# Patient Record
Sex: Female | Born: 1970 | Race: White | Hispanic: No | Marital: Single | Smoking: Never smoker
Health system: Southern US, Community
[De-identification: ages and names within clinical notes are randomized; demographics above are authoritative.]

## PROBLEM LIST (undated history)

## (undated) ENCOUNTER — Ambulatory Visit: Disposition: A | Discharge status: Home / Self Care

## (undated) DIAGNOSIS — I739 Peripheral vascular disease, unspecified: Secondary | ICD-10-CM

## (undated) DIAGNOSIS — K635 Polyp of colon: Secondary | ICD-10-CM

## (undated) DIAGNOSIS — B009 Herpesviral infection, unspecified: Secondary | ICD-10-CM

## (undated) DIAGNOSIS — E785 Hyperlipidemia, unspecified: Secondary | ICD-10-CM

## (undated) DIAGNOSIS — Z860100 Personal history of colon polyps, unspecified: Secondary | ICD-10-CM

## (undated) DIAGNOSIS — G709 Myoneural disorder, unspecified: Secondary | ICD-10-CM

## (undated) DIAGNOSIS — F32A Depression, unspecified: Secondary | ICD-10-CM

## (undated) DIAGNOSIS — Z460 Encounter for fitting and adjustment of spectacles and contact lenses: Secondary | ICD-10-CM

## (undated) DIAGNOSIS — R5383 Other fatigue: Secondary | ICD-10-CM

## (undated) DIAGNOSIS — K8060 Calculus of gallbladder and bile duct with cholecystitis, unspecified, without obstruction: Secondary | ICD-10-CM

## (undated) DIAGNOSIS — Z87442 Personal history of urinary calculi: Secondary | ICD-10-CM

## (undated) DIAGNOSIS — G35 Multiple sclerosis: Secondary | ICD-10-CM

## (undated) DIAGNOSIS — K469 Unspecified abdominal hernia without obstruction or gangrene: Secondary | ICD-10-CM

## (undated) DIAGNOSIS — N870 Mild cervical dysplasia: Secondary | ICD-10-CM

## (undated) DIAGNOSIS — F329 Major depressive disorder, single episode, unspecified: Secondary | ICD-10-CM

## (undated) HISTORY — DX: Polyp of colon: K63.5

## (undated) HISTORY — DX: Hyperlipidemia, unspecified: E78.5

## (undated) HISTORY — DX: Personal history of urinary calculi: Z87.442

## (undated) HISTORY — DX: Encounter for fitting and adjustment of spectacles and contact lenses: Z46.0

## (undated) HISTORY — PX: OTHER SURGICAL HISTORY: SHX169

## (undated) HISTORY — DX: Depression, unspecified: F32.A

## (undated) HISTORY — DX: Personal history of colonic polyps: Z86.010

## (undated) HISTORY — DX: Personal history of colon polyps, unspecified: Z86.0100

## (undated) HISTORY — DX: Other fatigue: R53.83

## (undated) HISTORY — DX: Mild cervical dysplasia: N87.0

## (undated) HISTORY — DX: Multiple sclerosis: G35

## (undated) HISTORY — DX: Herpesviral infection, unspecified: B00.9

## (undated) HISTORY — PX: COLONOSCOPY: SHX174

## (undated) HISTORY — DX: Major depressive disorder, single episode, unspecified: F32.9

## (undated) HISTORY — DX: Unspecified abdominal hernia without obstruction or gangrene: K46.9

---

## 1995-02-22 HISTORY — PX: HERNIA REPAIR: SHX51

## 1998-09-26 ENCOUNTER — Other Ambulatory Visit: Admission: RE | Admit: 1998-09-26 | Discharge: 1998-09-26 | Payer: Self-pay | Admitting: Internal Medicine

## 1999-10-06 ENCOUNTER — Other Ambulatory Visit: Admission: RE | Admit: 1999-10-06 | Discharge: 1999-10-06 | Payer: Self-pay | Admitting: *Deleted

## 2000-11-12 ENCOUNTER — Other Ambulatory Visit: Admission: RE | Admit: 2000-11-12 | Discharge: 2000-11-12 | Payer: Self-pay | Admitting: Obstetrics and Gynecology

## 2000-11-12 ENCOUNTER — Other Ambulatory Visit: Admission: RE | Admit: 2000-11-12 | Discharge: 2000-11-12 | Payer: Self-pay | Admitting: *Deleted

## 2000-12-24 ENCOUNTER — Other Ambulatory Visit: Admission: RE | Admit: 2000-12-24 | Discharge: 2000-12-24 | Payer: Self-pay | Admitting: Obstetrics and Gynecology

## 2000-12-24 ENCOUNTER — Encounter (INDEPENDENT_AMBULATORY_CARE_PROVIDER_SITE_OTHER): Payer: Self-pay

## 2001-07-20 ENCOUNTER — Other Ambulatory Visit: Admission: RE | Admit: 2001-07-20 | Discharge: 2001-07-20 | Payer: Self-pay | Admitting: Obstetrics and Gynecology

## 2001-10-25 ENCOUNTER — Other Ambulatory Visit: Admission: RE | Admit: 2001-10-25 | Discharge: 2001-10-25 | Payer: Self-pay | Admitting: Obstetrics and Gynecology

## 2002-11-06 ENCOUNTER — Other Ambulatory Visit: Admission: RE | Admit: 2002-11-06 | Discharge: 2002-11-06 | Payer: Self-pay | Admitting: Obstetrics and Gynecology

## 2003-07-05 ENCOUNTER — Other Ambulatory Visit: Admission: RE | Admit: 2003-07-05 | Discharge: 2003-07-05 | Payer: Self-pay | Admitting: Obstetrics and Gynecology

## 2003-11-19 ENCOUNTER — Other Ambulatory Visit: Admission: RE | Admit: 2003-11-19 | Discharge: 2003-11-19 | Payer: Self-pay | Admitting: Obstetrics and Gynecology

## 2005-01-27 ENCOUNTER — Other Ambulatory Visit: Admission: RE | Admit: 2005-01-27 | Discharge: 2005-01-27 | Payer: Self-pay | Admitting: Obstetrics and Gynecology

## 2006-02-16 ENCOUNTER — Other Ambulatory Visit: Admission: RE | Admit: 2006-02-16 | Discharge: 2006-02-16 | Payer: Self-pay | Admitting: Obstetrics & Gynecology

## 2007-03-15 ENCOUNTER — Other Ambulatory Visit: Admission: RE | Admit: 2007-03-15 | Discharge: 2007-03-15 | Payer: Self-pay | Admitting: Obstetrics & Gynecology

## 2007-10-14 ENCOUNTER — Other Ambulatory Visit: Admission: RE | Admit: 2007-10-14 | Discharge: 2007-10-14 | Payer: Self-pay | Admitting: Obstetrics and Gynecology

## 2008-04-17 ENCOUNTER — Other Ambulatory Visit: Admission: RE | Admit: 2008-04-17 | Discharge: 2008-04-17 | Payer: Self-pay | Admitting: Obstetrics and Gynecology

## 2008-05-04 ENCOUNTER — Encounter: Admission: RE | Admit: 2008-05-04 | Discharge: 2008-05-04 | Payer: Self-pay | Admitting: Obstetrics and Gynecology

## 2008-05-22 ENCOUNTER — Ambulatory Visit: Payer: Self-pay | Admitting: Pulmonary Disease

## 2008-05-22 DIAGNOSIS — G4710 Hypersomnia, unspecified: Secondary | ICD-10-CM | POA: Insufficient documentation

## 2008-05-22 DIAGNOSIS — E785 Hyperlipidemia, unspecified: Secondary | ICD-10-CM | POA: Insufficient documentation

## 2008-05-22 DIAGNOSIS — J309 Allergic rhinitis, unspecified: Secondary | ICD-10-CM | POA: Insufficient documentation

## 2009-03-13 ENCOUNTER — Ambulatory Visit: Payer: Self-pay | Admitting: Diagnostic Radiology

## 2009-03-13 ENCOUNTER — Emergency Department (HOSPITAL_BASED_OUTPATIENT_CLINIC_OR_DEPARTMENT_OTHER): Admission: EM | Admit: 2009-03-13 | Discharge: 2009-03-13 | Payer: Self-pay | Admitting: Emergency Medicine

## 2009-05-06 ENCOUNTER — Encounter: Admission: RE | Admit: 2009-05-06 | Discharge: 2009-05-06 | Payer: Self-pay | Admitting: Obstetrics and Gynecology

## 2010-07-20 IMAGING — MG MM DIGITAL SCREENING BILAT W/ CAD
4 series · 4 of 4 positions shown · non-contrast
Comparison: none

DG SCREEN MAMMOGRAM BILATERAL
Bilateral CC and MLO view(s) were taken.
Technologist: Amh Malawi

DIGITAL SCREENING MAMMOGRAM WITH CAD:
There are scattered fibroglandular densities.  No masses or malignant type calcifications are 
identified.

[R CC]
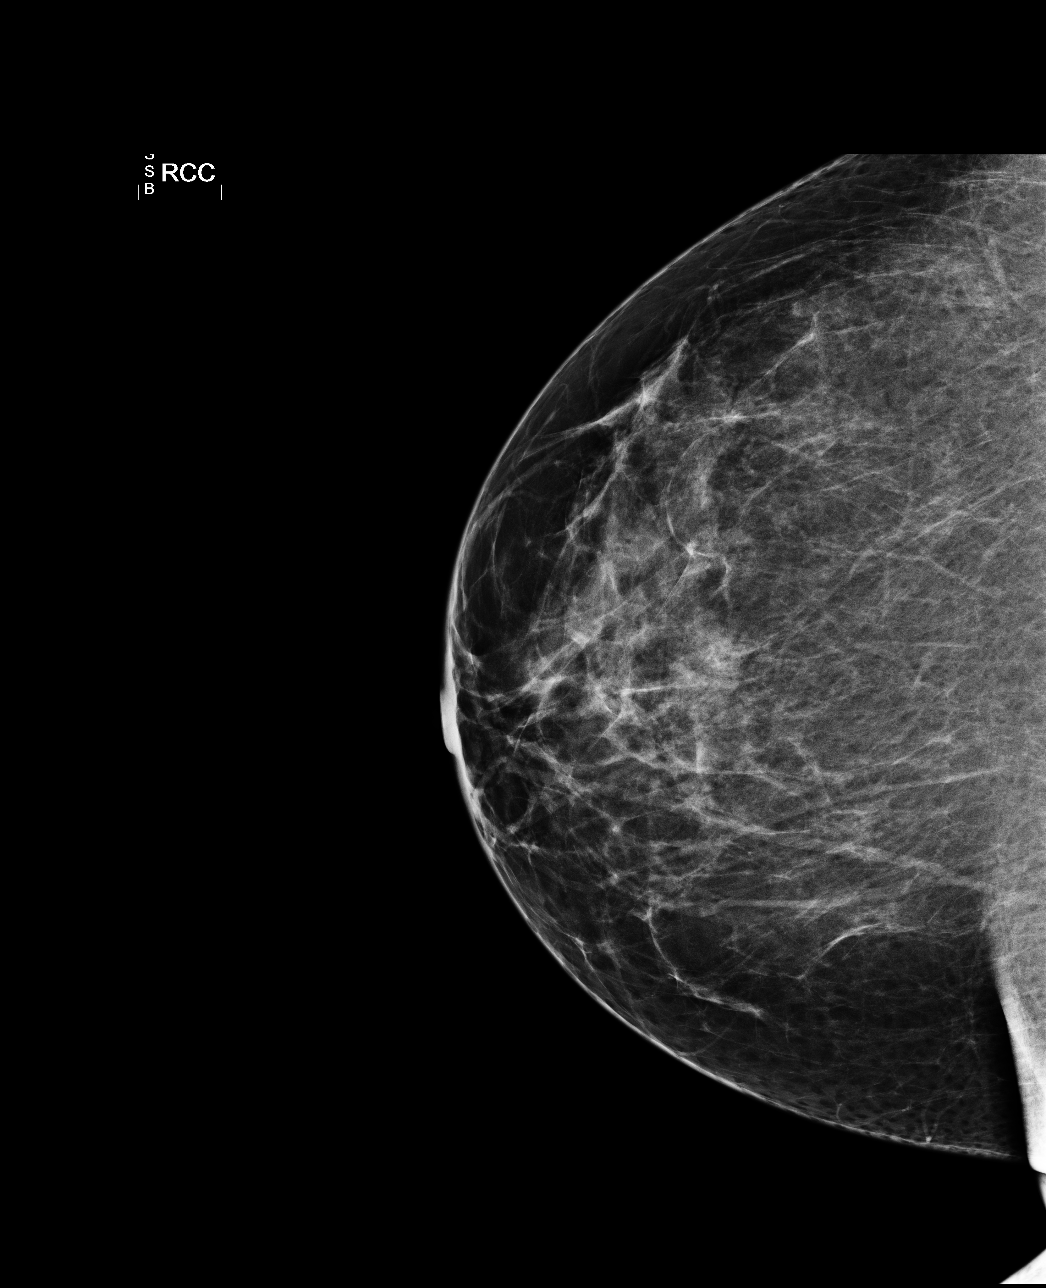

[L CC]
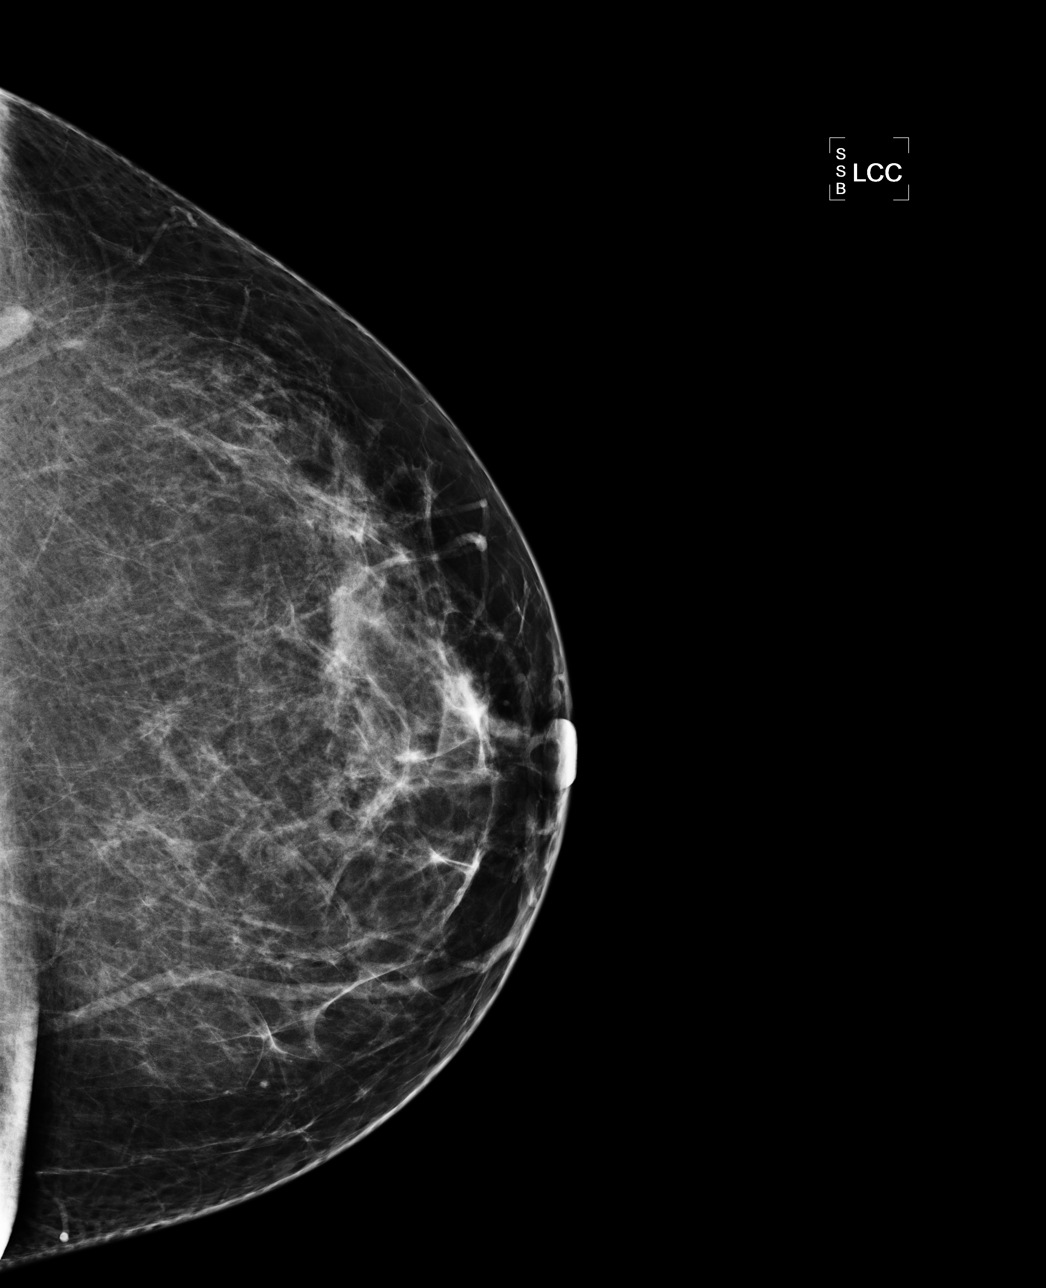

[L MLO]
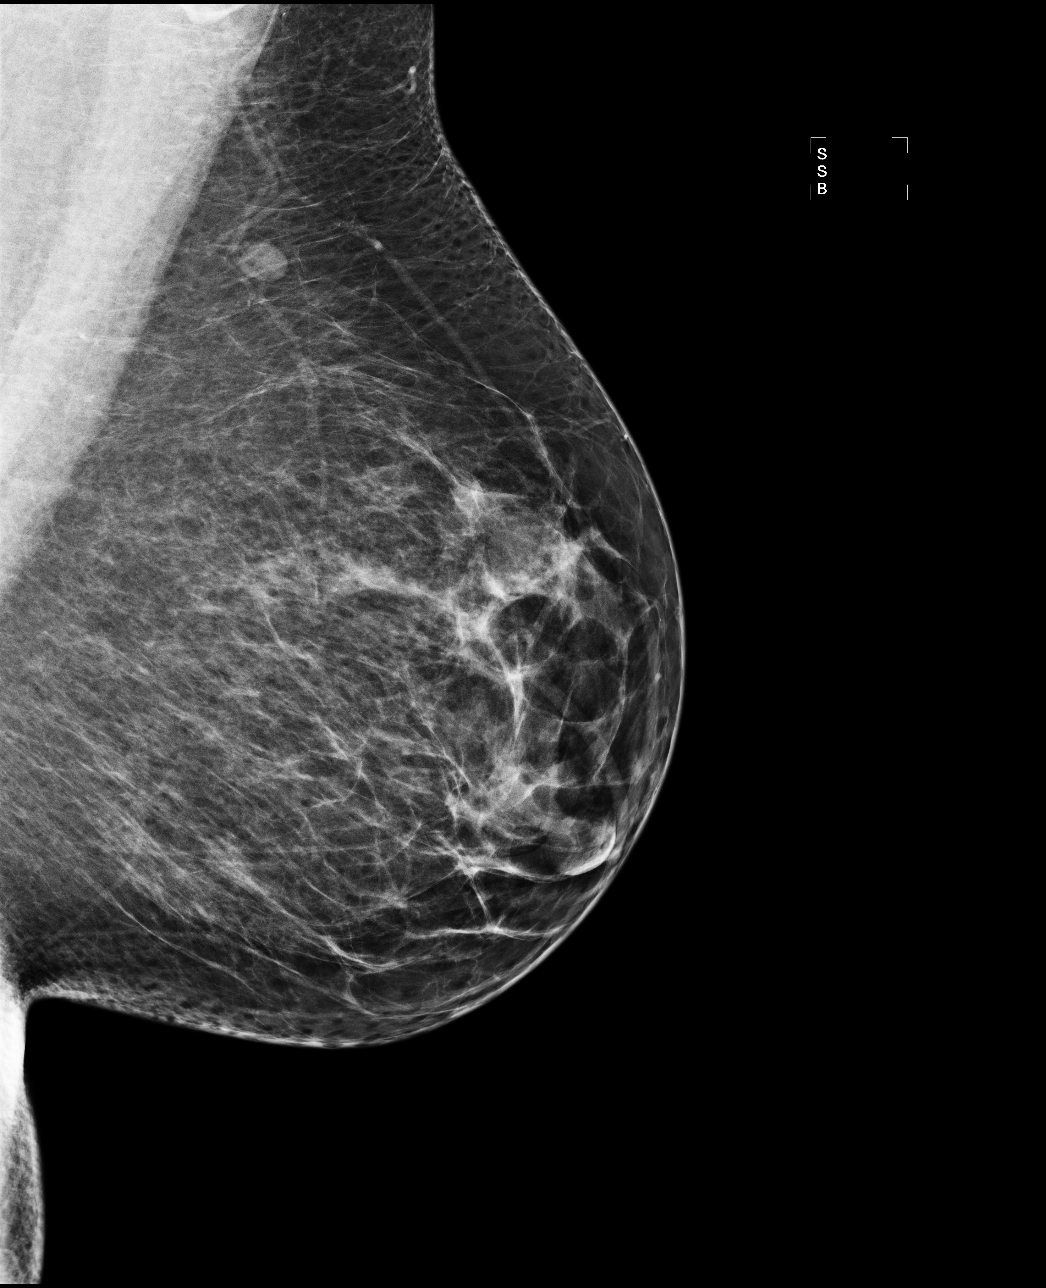

[R MLO]
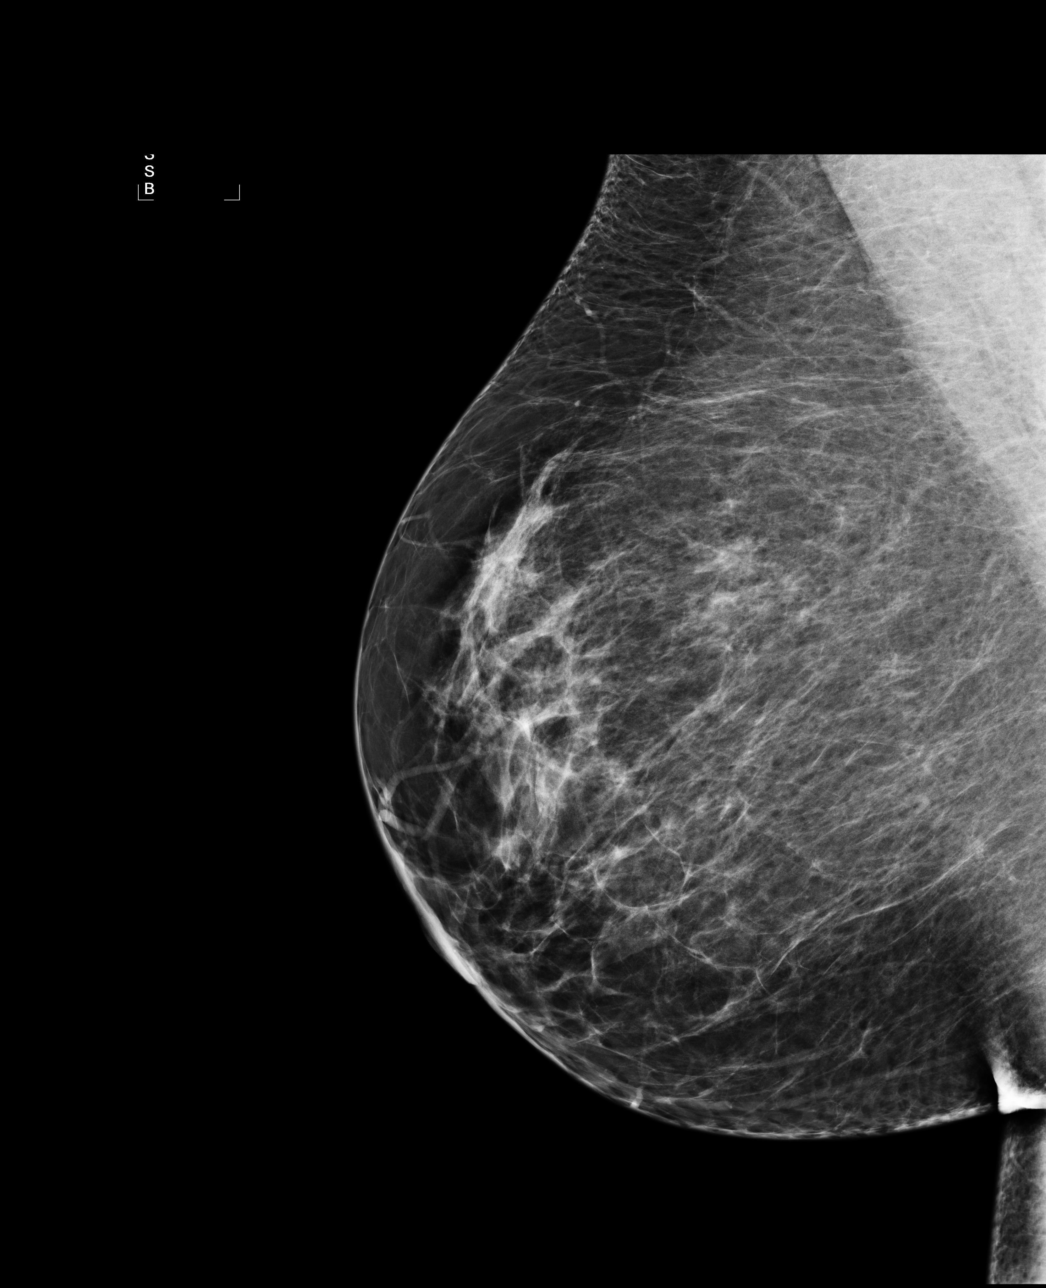

[4 of 4 positions shown; findings below may reference images not displayed]

IMPRESSION: No specific mammographic evidence of malignancy.  Next screening mammogram is recommended in one 
year.

ASSESSMENT: Negative - BI-RADS 1

Screening mammogram in 1 year.
ANALYZED BY COMPUTER AIDED DETECTION. , THIS PROCEDURE WAS A DIGITAL MAMMOGRAM.

## 2010-09-13 ENCOUNTER — Encounter: Payer: Self-pay | Admitting: Obstetrics and Gynecology

## 2010-09-14 ENCOUNTER — Encounter: Payer: Self-pay | Admitting: Obstetrics and Gynecology

## 2010-11-10 ENCOUNTER — Emergency Department (INDEPENDENT_AMBULATORY_CARE_PROVIDER_SITE_OTHER): Payer: 59

## 2010-11-10 ENCOUNTER — Emergency Department (HOSPITAL_BASED_OUTPATIENT_CLINIC_OR_DEPARTMENT_OTHER)
Admission: EM | Admit: 2010-11-10 | Discharge: 2010-11-11 | Disposition: A | Discharge status: Home / Self Care | Payer: 59 | Attending: Emergency Medicine | Admitting: Emergency Medicine

## 2010-11-10 DIAGNOSIS — E7800 Pure hypercholesterolemia, unspecified: Secondary | ICD-10-CM | POA: Insufficient documentation

## 2010-11-10 DIAGNOSIS — G35 Multiple sclerosis: Secondary | ICD-10-CM | POA: Insufficient documentation

## 2010-11-10 DIAGNOSIS — R109 Unspecified abdominal pain: Secondary | ICD-10-CM | POA: Insufficient documentation

## 2010-11-10 DIAGNOSIS — N200 Calculus of kidney: Secondary | ICD-10-CM | POA: Insufficient documentation

## 2010-11-10 DIAGNOSIS — N1330 Unspecified hydronephrosis: Secondary | ICD-10-CM

## 2010-11-10 DIAGNOSIS — N201 Calculus of ureter: Secondary | ICD-10-CM

## 2010-11-10 LAB — URINE MICROSCOPIC-ADD ON

## 2010-11-10 LAB — PREGNANCY, URINE: Preg Test, Ur: NEGATIVE

## 2010-11-10 LAB — BASIC METABOLIC PANEL
BUN: 17 mg/dL (ref 6–23)
CO2: 22 mEq/L (ref 19–32)
Calcium: 9.1 mg/dL (ref 8.4–10.5)
Creatinine, Ser: 1.2 mg/dL (ref 0.4–1.2)
Glucose, Bld: 126 mg/dL — ABNORMAL HIGH (ref 70–99)

## 2010-11-10 LAB — URINALYSIS, ROUTINE W REFLEX MICROSCOPIC
Leukocytes, UA: NEGATIVE
Nitrite: NEGATIVE
Specific Gravity, Urine: 1.035 — ABNORMAL HIGH (ref 1.005–1.030)
Urobilinogen, UA: 0.2 mg/dL (ref 0.0–1.0)
pH: 6 (ref 5.0–8.0)

## 2010-11-30 LAB — DIFFERENTIAL
Basophils Relative: 1 % (ref 0–1)
Eosinophils Absolute: 0.1 10*3/uL (ref 0.0–0.7)
Monocytes Absolute: 0.4 10*3/uL (ref 0.1–1.0)
Monocytes Relative: 5 % (ref 3–12)
Neutrophils Relative %: 87 % — ABNORMAL HIGH (ref 43–77)

## 2010-11-30 LAB — COMPREHENSIVE METABOLIC PANEL
ALT: 25 U/L (ref 0–35)
Albumin: 4.2 g/dL (ref 3.5–5.2)
Alkaline Phosphatase: 103 U/L (ref 39–117)
Glucose, Bld: 118 mg/dL — ABNORMAL HIGH (ref 70–99)
Potassium: 4 mEq/L (ref 3.5–5.1)
Sodium: 141 mEq/L (ref 135–145)
Total Protein: 7.7 g/dL (ref 6.0–8.3)

## 2010-11-30 LAB — URINALYSIS, ROUTINE W REFLEX MICROSCOPIC
Bilirubin Urine: NEGATIVE
Glucose, UA: NEGATIVE mg/dL
Ketones, ur: NEGATIVE mg/dL
Protein, ur: NEGATIVE mg/dL

## 2010-11-30 LAB — URINE MICROSCOPIC-ADD ON

## 2010-11-30 LAB — CBC
Hemoglobin: 14.1 g/dL (ref 12.0–15.0)
RDW: 13.1 % (ref 11.5–15.5)

## 2011-08-20 ENCOUNTER — Encounter (INDEPENDENT_AMBULATORY_CARE_PROVIDER_SITE_OTHER): Payer: Self-pay | Admitting: Surgery

## 2011-08-20 ENCOUNTER — Other Ambulatory Visit (INDEPENDENT_AMBULATORY_CARE_PROVIDER_SITE_OTHER): Payer: Self-pay | Admitting: General Surgery

## 2011-08-20 ENCOUNTER — Ambulatory Visit (INDEPENDENT_AMBULATORY_CARE_PROVIDER_SITE_OTHER): Payer: 59 | Admitting: Surgery

## 2011-08-20 DIAGNOSIS — G35 Multiple sclerosis: Secondary | ICD-10-CM

## 2011-08-20 NOTE — Progress Notes (Signed)
Chief Complaint:  Obesity, BMI of 41.8  History of Present Illness:  Samantha Tyler is an 40 y.o. female who has had lifelong issues with her weight. She has been to one of our sessions and is interested in having a laparoscopic adjustable gastric band. She has never had any abdominal surgery. She has tried multiple weight loss attempts with limited success. Her most recent issues been multiple sclerosis which presented as itching, numbness and tingling of her back when she got home from the beach a few years ago. She was initially diagnosed and treated at [facility] but now her daughter from that institution is with [facility] out in advance. Before actually having the pain and she will see the approval of her neurologist.  She went to proceed however the workup and we will begin its journey.  Past Medical History  Diagnosis Date  . Hyperlipidemia   . Depression   . Chronic kidney disease   . History of colon polyps   . Multiple sclerosis   . Fatigue   . Hernia   . Colon polyps   . History of colon polyps   . Contact lens/glasses fitting     Past Surgical History  Procedure Date  . Hernia repair 02/1995    inguinal    Current Outpatient Prescriptions  Medication Sig Dispense Refill  . natalizumab (TYSABRI) 300 MG/15ML injection Inject into the vein every 30 (thirty) days.        Marland Kitchen atorvastatin (LIPITOR) 80 MG tablet 80 mg daily.       Marland Kitchen buPROPion (WELLBUTRIN XL) 300 MG 24 hr tablet 300 mg daily.       . citalopram (CELEXA) 40 MG tablet daily.       Marland Kitchen spironolactone (ALDACTONE) 100 MG tablet        Review of patient's allergies indicates no known allergies. Family History  Problem Relation Age of Onset  . Hyperlipidemia Mother   . Hypertension Mother   . Hypothyroidism Mother   . Hyperlipidemia Father   . Hypertension Father    Social History:   reports that she has never smoked. She has never used smokeless tobacco. She reports that she does not drink alcohol or use  illicit drugs.   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 Her review of systems is positive for loss of sleep and fatigue, high cholesterol, colon polyp, hernia, kidney disease, and glasses. She also had a right superficial phlebitis treated with compresses and Mobic.  Physical Exam:   Blood pressure 120/68, pulse 68, temperature 97.8 F (36.6 C), temperature source Temporal, resp. rate 18, height 5\' 9"  (1.753 m), weight 282 lb 12.8 oz (128.277 kg). Body mass index is 41.76 kg/(m^2).  Gen:  WDWN white female NAD  Neurological: Alert and oriented to person, place, and time. Motor and sensory function is grossly intact  Head: Normocephalic and atraumatic.  Eyes: Conjunctivae are normal. Pupils are equal, round, and reactive to light. No scleral icterus.  Neck: Normal range of motion. Neck supple. No tracheal deviation or thyromegaly present.  Cardiovascular:  SR without murmurs or gallops.  No carotid bruits Respiratory: Effort normal.  No respiratory distress. No chest wall tenderness. Breath sounds normal.  No wheezes, rales or rhonchi.  Abdomen:  nontender no surgery GU: Musculoskeletal: Normal range of motion. Extremities are nontender. No cyanosis, edema or clubbing noted Lymphadenopathy: No cervical, preauricular, postauricular or axillary adenopathy is present Skin: Skin is warm and  dry. No rash noted. No diaphoresis. No erythema. No pallor. Pscyh: Normal mood and affect. Behavior is normal. Judgment and thought content normal.   LABORATORY RESULTS: No results found for this or any previous visit (from the past 48 hour(s)).  RADIOLOGY RESULTS: No results found.  Problem List: Patient Active Problem List  Diagnoses  . HYPERLIPIDEMIA  . HYPERSOMNIA, PERSISTENT  . ALLERGIC RHINITIS  . MS (multiple sclerosis)  . Obesity, Class III, BMI 40-49.9 (morbid obesity)    Assessment & Plan: Morbid obesity in a patient with a  BMI of 42 and is interested in a LAP-BAND. The LAP-BAND booklet has been given to her. We'll begin workup.    Matt B. Daphine Deutscher, MD, [facility] [phone] beeper [phone]  08/20/2011 11:54 AM

## 2011-08-20 NOTE — Patient Instructions (Addendum)
Scheduling per Leandrew Koyanagi

## 2011-08-21 ENCOUNTER — Other Ambulatory Visit: Payer: Self-pay | Admitting: Obstetrics and Gynecology

## 2011-08-21 DIAGNOSIS — Z1231 Encounter for screening mammogram for malignant neoplasm of breast: Secondary | ICD-10-CM

## 2011-09-02 ENCOUNTER — Encounter: Payer: Self-pay | Admitting: *Deleted

## 2011-09-02 ENCOUNTER — Encounter: Payer: 59 | Attending: Surgery | Admitting: *Deleted

## 2011-09-02 DIAGNOSIS — Z01818 Encounter for other preprocedural examination: Secondary | ICD-10-CM | POA: Insufficient documentation

## 2011-09-02 DIAGNOSIS — Z713 Dietary counseling and surveillance: Secondary | ICD-10-CM | POA: Insufficient documentation

## 2011-09-02 NOTE — Patient Instructions (Signed)
   Follow Pre-Op Nutrition Goals to prepare for LAGB Surgery.   Call the [facility] at [phone] once you have been given your surgery date to enrolled in the Pre-Op Nutrition Class. You will need to attend this nutrition class 3-4 weeks prior to your surgery. 

## 2011-09-02 NOTE — Progress Notes (Signed)
  Pre-Op Assessment Visit: Pre-Operative LAGB Surgery  Medical Nutrition Therapy:  Appt start time: 0830 end time:  0930.  Patient was seen on 09/02/2011 for Pre-Operative LAGB Nutrition Assessment. Assessment and letter of approval faxed to [facility] Bariatric Surgery Program coordinator on 09/02/2011.  Approval letter sent to [facility] and will be available in the chart under the media tab.  Handouts given during visit include:  Pre-Op Goals Handout  Patient to call for Pre-Op and Post-Op Nutrition Education at the [facility] when surgery is scheduled.

## 2011-09-08 ENCOUNTER — Encounter (HOSPITAL_COMMUNITY): Payer: Self-pay | Admitting: Pharmacy Technician

## 2011-09-10 ENCOUNTER — Ambulatory Visit (HOSPITAL_COMMUNITY)
Admission: RE | Admit: 2011-09-10 | Discharge: 2011-09-10 | Disposition: A | Discharge status: Home / Self Care | Payer: 59 | Source: Ambulatory Visit | Attending: Surgery | Admitting: Surgery

## 2011-09-10 ENCOUNTER — Other Ambulatory Visit: Payer: Self-pay

## 2011-09-10 DIAGNOSIS — E785 Hyperlipidemia, unspecified: Secondary | ICD-10-CM | POA: Insufficient documentation

## 2011-09-10 DIAGNOSIS — F329 Major depressive disorder, single episode, unspecified: Secondary | ICD-10-CM | POA: Insufficient documentation

## 2011-09-10 DIAGNOSIS — N289 Disorder of kidney and ureter, unspecified: Secondary | ICD-10-CM | POA: Insufficient documentation

## 2011-09-10 DIAGNOSIS — F3289 Other specified depressive episodes: Secondary | ICD-10-CM | POA: Insufficient documentation

## 2011-09-10 DIAGNOSIS — Z6841 Body Mass Index (BMI) 40.0 and over, adult: Secondary | ICD-10-CM | POA: Insufficient documentation

## 2011-09-10 DIAGNOSIS — Z13820 Encounter for screening for osteoporosis: Secondary | ICD-10-CM | POA: Insufficient documentation

## 2011-09-16 ENCOUNTER — Ambulatory Visit
Admission: RE | Admit: 2011-09-16 | Discharge: 2011-09-16 | Disposition: A | Discharge status: Home / Self Care | Payer: 59 | Source: Ambulatory Visit | Attending: Obstetrics and Gynecology | Admitting: Obstetrics and Gynecology

## 2011-09-16 ENCOUNTER — Ambulatory Visit (HOSPITAL_COMMUNITY)
Admission: RE | Admit: 2011-09-16 | Discharge: 2011-09-16 | Disposition: A | Discharge status: Home / Self Care | Payer: 59 | Source: Ambulatory Visit | Attending: Surgery | Admitting: Surgery

## 2011-09-16 DIAGNOSIS — Z6841 Body Mass Index (BMI) 40.0 and over, adult: Secondary | ICD-10-CM | POA: Insufficient documentation

## 2011-09-16 DIAGNOSIS — Z1231 Encounter for screening mammogram for malignant neoplasm of breast: Secondary | ICD-10-CM

## 2011-09-16 DIAGNOSIS — N289 Disorder of kidney and ureter, unspecified: Secondary | ICD-10-CM | POA: Insufficient documentation

## 2011-09-16 DIAGNOSIS — E785 Hyperlipidemia, unspecified: Secondary | ICD-10-CM | POA: Insufficient documentation

## 2011-09-16 DIAGNOSIS — G35 Multiple sclerosis: Secondary | ICD-10-CM | POA: Insufficient documentation

## 2011-09-17 ENCOUNTER — Ambulatory Visit (HOSPITAL_BASED_OUTPATIENT_CLINIC_OR_DEPARTMENT_OTHER): Payer: 59 | Attending: Surgery | Admitting: Radiology

## 2011-09-17 DIAGNOSIS — G4733 Obstructive sleep apnea (adult) (pediatric): Secondary | ICD-10-CM | POA: Insufficient documentation

## 2011-09-18 ENCOUNTER — Encounter (HOSPITAL_COMMUNITY): Admission: RE | Payer: Self-pay | Source: Ambulatory Visit

## 2011-09-18 ENCOUNTER — Ambulatory Visit (HOSPITAL_COMMUNITY): Admission: RE | Admit: 2011-09-18 | Payer: 59 | Source: Ambulatory Visit | Admitting: Surgery

## 2011-09-18 SURGERY — BREATH TEST, FOR HELICOBACTER PYLORI

## 2011-09-20 DIAGNOSIS — G4733 Obstructive sleep apnea (adult) (pediatric): Secondary | ICD-10-CM

## 2011-09-21 NOTE — Procedures (Signed)
NAME:  Samantha Tyler, Samantha Tyler                 ACCOUNT NO.:  [id number]  MEDICAL RECORD NO.:  [id number]          PATIENT TYPE:  OUT  LOCATION:  SLEEP CENTER                 FACILITY:  [facility]  PHYSICIAN:  Juliana Boling D. Maple Hudson, MD, FCCP, FACPDATE OF BIRTH:  1973/03/15  DATE OF STUDY:  09/17/2011                           NOCTURNAL POLYSOMNOGRAM  REFERRING PHYSICIAN:  Thornton Park. Daphine Deutscher, MD  REFERRING PHYSICIAN:  Thornton Park. Daphine Deutscher, MD  INDICATION FOR STUDY:  Hypersomnia with sleep apnea.  EPWORTH SLEEPINESS SCORE:  5/24.  BMI 41.6, weight 282 pounds, height 69 inches, neck 15.5 inches.  MEDICATIONS:  Home medications charted and reviewed.  SLEEP ARCHITECTURE:  Total sleep time 309.5 minutes with sleep efficiency 86.8%.  Stage I was 4.7%, stage II 71.7%, stage III absent, REM 23.6% of total sleep time.  Sleep latency 30 minutes, REM latency 106 minutes, awake after sleep onset 10 minutes, arousal index 14.5.  Bedtime medication:  Benadryl, melatonin.  RESPIRATORY DATA:  Apnea-hypopnea index (AHI) 5.6 per hour.  A total of 29 events was scored, all as hypopneas while supine and mostly in REM. REM AHI 22.2 per hour.  There were insufficient numbers of events to permit application of split protocol CPAP titration on the study night.  OXYGEN DATA:  Moderately loud intermittent snoring with oxygen desaturation to a nadir of 82% and a mean oxygen saturation through the study of 93% on room air.  CARDIAC DATA:  Sinus rhythm with frequent PVCs.  MOVEMENT-PARASOMNIA:  A total of 24 limb jerks were counted, but none were noted to affect sleep quality.  No bathroom trips.  IMPRESSIONS-RECOMMENDATIONS: 1. Minimal obstructive sleep apnea/hypopnea syndrome, apnea/hypopnea     index 5.6 per hour.  The normal range for adults is between 0-5     events per hour, so this is barely above the upper limits of     normal.  Moderately loud intermittent snoring with oxygen     desaturation to a nadir of 82% and  mean oxygen saturation of 93% on     room air.  Frequent PVCs. 2. Scores in this range would ordinarily be treated with     recommendation to lose weight and encouraged to sleep off flat on     back, rather than CPAP as initial therapy.     Brilynn Biasi D. Maple Hudson, MD, [facility], FACP Diplomate, Biomedical engineer of Sleep Medicine Electronically Signed    CDY/MEDQ  D:  09/20/2011 09:47:08  T:  09/20/2011 12:18:15  Job:  [id number]

## 2011-09-30 ENCOUNTER — Encounter (HOSPITAL_COMMUNITY): Admission: RE | Payer: Self-pay | Source: Ambulatory Visit

## 2011-09-30 SURGERY — BREATH TEST, FOR HELICOBACTER PYLORI

## 2011-10-06 ENCOUNTER — Ambulatory Visit (HOSPITAL_COMMUNITY): Admission: RE | Admit: 2011-10-06 | Payer: 59 | Source: Ambulatory Visit | Admitting: Surgery

## 2011-10-23 ENCOUNTER — Encounter (HOSPITAL_COMMUNITY): Payer: Self-pay | Admitting: Surgery

## 2011-10-23 ENCOUNTER — Encounter (HOSPITAL_COMMUNITY): Payer: Self-pay | Admitting: Pharmacy Technician

## 2011-10-23 ENCOUNTER — Encounter (HOSPITAL_COMMUNITY)
Admission: RE | Disposition: A | Discharge status: Home Health | Payer: Self-pay | Source: Ambulatory Visit | Attending: Surgery

## 2011-10-23 ENCOUNTER — Ambulatory Visit (HOSPITAL_COMMUNITY)
Admission: RE | Admit: 2011-10-23 | Discharge: 2011-10-23 | Disposition: A | Discharge status: Home Health | Payer: 59 | Source: Ambulatory Visit | Attending: Surgery | Admitting: Surgery

## 2011-10-23 DIAGNOSIS — Z01818 Encounter for other preprocedural examination: Secondary | ICD-10-CM | POA: Insufficient documentation

## 2011-10-23 HISTORY — PX: BREATH TEK H PYLORI: SHX5422

## 2011-10-23 SURGERY — BREATH TEST, FOR HELICOBACTER PYLORI

## 2011-11-02 ENCOUNTER — Telehealth (INDEPENDENT_AMBULATORY_CARE_PROVIDER_SITE_OTHER): Payer: Self-pay | Admitting: General Surgery

## 2011-11-02 NOTE — Telephone Encounter (Signed)
Erroneous encounter

## 2011-11-02 NOTE — Telephone Encounter (Deleted)
Per Toni Amend with [facility] the patients drain came out this weekend and is concerned that she needs to have it replaced, per Dr Janee Morn, okay to leave it out and she needs to be monitored for any fluid collection.

## 2011-12-10 ENCOUNTER — Encounter: Payer: 59 | Attending: Surgery | Admitting: *Deleted

## 2011-12-10 DIAGNOSIS — Z01818 Encounter for other preprocedural examination: Secondary | ICD-10-CM | POA: Insufficient documentation

## 2011-12-10 DIAGNOSIS — Z713 Dietary counseling and surveillance: Secondary | ICD-10-CM | POA: Insufficient documentation

## 2011-12-13 NOTE — Progress Notes (Signed)
  Bariatric Class:  Appt start time: 0830 end time:  0930.  Pre-Operative Nutrition Class  Patient was seen on 12/10/2011 for Pre-Operative Bariatric Surgery Education at the [facility].  Surgery date: 12/29/11 Surgery type: LAGB  Last weight @ [facility]: 282.6 lbs (09/02/11)  Samples given per MNT protocol: Bariatric Advantage Multivitamin Lot # [id number] Exp: 09/13  Bariatric Advantage Calcium Citrate Lot # [id number] Exp: 09/13  Bariatric Advantage B-12 dots Lot # [id number] Exp: 05/13  Celebrate Vitamins Multivitamin Lot # [id number] Exp: 06/14  Celebrate Vitamins Calcium Citrate Lot # [id number] Exp: 07/13  Celebrate Vitamins B-12 dots Lot # [id number] Exp: 07/14  Samantha Tyler  Lot # [id number] Exp: 06/4  The following the learning objective met by the patient during this course:   Identifies Pre-Op Dietary Goals and will begin 2 weeks pre-operatively   Identifies appropriate sources of fluids and proteins   States protein recommendations and appropriate sources pre and post-operatively  Identifies Post-Operative Dietary Goals and will follow for 2 weeks post-operatively  Identifies appropriate multivitamin and calcium sources  Describes the need for physical activity post-operatively and will follow MD recommendations  States when to call healthcare provider regarding medication questions or post-operative complications  Handouts given during class include:  Pre-Op Bariatric Surgery Diet Handout  Protein Shake Handout  Post-Op Bariatric Surgery Nutrition Handout  BELT Program Information Flyer  Support Group Information Flyer  Follow-Up Plan: Patient will follow-up at [facility] 2 weeks post operatively for diet advancement per MD.

## 2011-12-13 NOTE — Patient Instructions (Signed)
Follow:   Pre-Op Diet per MD 2 weeks prior to surgery  Phase 2- Liquids (clear/full) 2 weeks after surgery  Vitamin/Mineral/Calcium guidelines for purchasing bariatric supplements  Exercise guidelines pre and post-op per MD  Follow-up at [facility] in 2 weeks post-op for diet advancement. Contact Truly Stankiewicz as needed with questions/concerns. 

## 2011-12-22 ENCOUNTER — Telehealth (INDEPENDENT_AMBULATORY_CARE_PROVIDER_SITE_OTHER): Payer: Self-pay

## 2011-12-22 ENCOUNTER — Encounter (HOSPITAL_COMMUNITY): Payer: Self-pay | Admitting: Pharmacy Technician

## 2011-12-22 NOTE — Telephone Encounter (Signed)
Albin Felling called to notify us that surgery is 5/7 and the pt is coming tomorrow for her preop visit.  They need orders in Epic

## 2011-12-23 ENCOUNTER — Encounter (HOSPITAL_COMMUNITY)
Admission: RE | Admit: 2011-12-23 | Discharge: 2011-12-23 | Disposition: A | Discharge status: Home / Self Care | Payer: 59 | Source: Ambulatory Visit | Attending: Surgery | Admitting: Surgery

## 2011-12-23 ENCOUNTER — Encounter (HOSPITAL_COMMUNITY): Payer: Self-pay

## 2011-12-23 ENCOUNTER — Telehealth (INDEPENDENT_AMBULATORY_CARE_PROVIDER_SITE_OTHER): Payer: Self-pay | Admitting: General Surgery

## 2011-12-23 DIAGNOSIS — G35 Multiple sclerosis: Secondary | ICD-10-CM

## 2011-12-23 HISTORY — DX: Multiple sclerosis: G35

## 2011-12-23 LAB — CBC
HCT: 41.2 % (ref 36.0–46.0)
Hemoglobin: 13.7 g/dL (ref 12.0–15.0)
MCV: 85.5 fL (ref 78.0–100.0)
WBC: 5.5 10*3/uL (ref 4.0–10.5)

## 2011-12-23 LAB — BASIC METABOLIC PANEL
BUN: 17 mg/dL (ref 6–23)
CO2: 25 mEq/L (ref 19–32)
Chloride: 101 mEq/L (ref 96–112)
GFR calc Af Amer: >90 mL/min (ref >90)
Potassium: 4 mEq/L (ref 3.5–5.1)

## 2011-12-23 LAB — SURGICAL PCR SCREEN: Staphylococcus aureus: POSITIVE — AB

## 2011-12-23 LAB — HCG, SERUM, QUALITATIVE: Preg, Serum: NEGATIVE

## 2011-12-23 NOTE — Telephone Encounter (Signed)
[place], [place] pre-op, needs Dr. Daphine Deutscher to enter Epic orders for [facility] ([birth year])

## 2011-12-23 NOTE — Patient Instructions (Signed)
[address] Samantha Tyler  12/23/2011   Your procedure is scheduled on: 5-7  -2013  Report to [facility] at       0900 AM..  Call this number if you have problems the morning of surgery: [phone]   Remember:   Do not eat food:After Midnight.    Take these medicines the morning of surgery with A SIP OF WATER: none.   Do not wear jewelry, make-up or nail polish.  Do not wear lotions, powders, or perfumes. You may wear deodorant.  Do not shave 48 hours prior to surgery.(face and neck okay, no shaving of legs)  Do not bring valuables to the hospital.  Contacts, dentures or bridgework may not be worn into surgery.  Leave suitcase in the car. After surgery it may be brought to your room.  For patients admitted to the hospital, checkout time is 11:00 AM the day of discharge.   Patients discharged the day of surgery will not be allowed to drive home.  Name and phone number of your driver: mother  Special Instructions: CHG Shower Use Special Wash: 1/2 bottle night before surgery and 1/2 bottle morning of surgery.(avoid face and genitals)   Please read over the following fact sheets that you were given: MRSA Information, Incentive Spirometry Instruction.

## 2011-12-24 ENCOUNTER — Encounter (INDEPENDENT_AMBULATORY_CARE_PROVIDER_SITE_OTHER): Payer: Self-pay | Admitting: Surgery

## 2011-12-24 ENCOUNTER — Ambulatory Visit (INDEPENDENT_AMBULATORY_CARE_PROVIDER_SITE_OTHER): Payer: 59 | Admitting: Surgery

## 2011-12-24 VITALS — BP 107/78 | HR 92 | Resp 20 | Ht 69.0 in | Wt 263.4 lb

## 2011-12-24 NOTE — Progress Notes (Signed)
Chief Complaint: Obesity, BMI of 41.8  History of Present Illness: Samantha Tyler is an 41 y.o. female who has had lifelong issues with her weight. She has been to one of our sessions and is interested in having a laparoscopic adjustable gastric band. She has never had any abdominal surgery. She has tried multiple weight loss attempts with limited success. Her most recent issues been multiple sclerosis which presented as itching, numbness and tingling of her back when she got home from the beach a few years ago. She was initially diagnosed and treated at [facility] but now her daughter from that institution is with [facility] out in advance. Before actually having the pain and she will see the approval of her neurologist.  She went to proceed however the workup and we will begin its journey.  Past Medical History   Diagnosis  Date   .  Hyperlipidemia    .  Depression    .  Chronic kidney disease    .  History of colon polyps    .  Multiple sclerosis    .  Fatigue    .  Hernia    .  Colon polyps    .  History of colon polyps    .  Contact lens/glasses fitting     Past Surgical History   Procedure  Date   .  Hernia repair  02/1995     inguinal    Current Outpatient Prescriptions   Medication  Sig  Dispense  Refill   .  natalizumab (TYSABRI) 300 MG/15ML injection  Inject into the vein every 30 (thirty) days.     Marland Kitchen  atorvastatin (LIPITOR) 80 MG tablet  80 mg daily.     Marland Kitchen  buPROPion (WELLBUTRIN XL) 300 MG 24 hr tablet  300 mg daily.     .  citalopram (CELEXA) 40 MG tablet  daily.     Marland Kitchen  spironolactone (ALDACTONE) 100 MG tablet      Review of patient's allergies indicates no known allergies.  Family History   Problem  Relation  Age of Onset   .  Hyperlipidemia  Mother    .  Hypertension  Mother    .  Hypothyroidism  Mother    .  Hyperlipidemia  Father    .  Hypertension  Father    Social History: reports that she has never smoked. She has never used smokeless tobacco. She reports that  she does not drink alcohol or use illicit drugs.  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 She also had a right superficial phlebitis treated with compresses and Mobic.  Physical Exam:  Blood pressure 120/68, pulse 68, temperature 97.8 F (36.6 C), temperature source Temporal, resp. rate 18, height 5\' 9"  (1.753 m), weight 282 lb 12.8 oz (128.277 kg).  Body mass index is 41.76 kg/(m^2).  Gen: WDWN white female NAD  Neurological: Alert and oriented to person, place, and time. Motor and sensory function is grossly intact  Head: Normocephalic and atraumatic.  Eyes: Conjunctivae are normal. Pupils are equal, round, and reactive to light. No scleral icterus.  Neck: Normal range of motion. Neck supple. No tracheal deviation or thyromegaly present.  Cardiovascular: SR without murmurs or gallops. No carotid bruits  Respiratory: Effort normal. No respiratory distress. No chest wall tenderness. Breath sounds  normal. No wheezes, rales or rhonchi.  Abdomen: nontender no surgery  GU:  Musculoskeletal: Normal range of motion. Extremities are nontender. No cyanosis, edema or clubbing noted Lymphadenopathy: No cervical, preauricular, postauricular or axillary adenopathy is present Skin: Skin is warm and dry. No rash noted. No diaphoresis. No erythema. No pallor. Pscyh: Normal mood and affect. Behavior is normal. Judgment and thought content normal.  LABORATORY RESULTS:  No results found for this or any previous visit (from the past 48 hour(s)).  RADIOLOGY RESULTS:  No results found.  Problem List:  Patient Active Problem List   Diagnoses   .  HYPERLIPIDEMIA   .  HYPERSOMNIA, PERSISTENT   .  ALLERGIC RHINITIS   .  MS (multiple sclerosis)   .  Obesity, Class III, BMI 40-49.9 (morbid obesity)   Assessment & Plan:    Morbid obesity in a patient with a BMI of 42 and is interested in a LAP-BAND. The LAP-BAND booklet has been given to her. Her UGI showed a small hiatus hernia.    Matt B. Daphine Deutscher, MD, [facility]  [phone] beeper  [phone]

## 2011-12-25 ENCOUNTER — Ambulatory Visit (INDEPENDENT_AMBULATORY_CARE_PROVIDER_SITE_OTHER): Payer: 59 | Admitting: Surgery

## 2011-12-28 NOTE — Progress Notes (Signed)
Pt schedule for surgery 12/29/11 @ 1130-currently no orders in computer from Dr's office

## 2011-12-29 ENCOUNTER — Encounter (HOSPITAL_COMMUNITY)
Admission: RE | Disposition: A | Discharge status: Home / Self Care | Payer: Self-pay | Source: Ambulatory Visit | Attending: Surgery

## 2011-12-29 ENCOUNTER — Encounter (HOSPITAL_COMMUNITY): Payer: Self-pay

## 2011-12-29 ENCOUNTER — Encounter (HOSPITAL_COMMUNITY): Payer: Self-pay | Admitting: Anesthesiology

## 2011-12-29 ENCOUNTER — Ambulatory Visit (HOSPITAL_COMMUNITY): Payer: 59 | Admitting: Anesthesiology

## 2011-12-29 ENCOUNTER — Inpatient Hospital Stay (HOSPITAL_COMMUNITY)
Admission: RE | Admit: 2011-12-29 | Discharge: 2011-12-30 | DRG: 621 | Disposition: A | Discharge status: Home / Self Care | Payer: 59 | Source: Ambulatory Visit | Attending: Surgery | Admitting: Surgery

## 2011-12-29 DIAGNOSIS — K449 Diaphragmatic hernia without obstruction or gangrene: Secondary | ICD-10-CM | POA: Diagnosis present

## 2011-12-29 DIAGNOSIS — Z860100 Personal history of colon polyps, unspecified: Secondary | ICD-10-CM

## 2011-12-29 DIAGNOSIS — F3289 Other specified depressive episodes: Secondary | ICD-10-CM | POA: Diagnosis present

## 2011-12-29 DIAGNOSIS — E785 Hyperlipidemia, unspecified: Secondary | ICD-10-CM

## 2011-12-29 DIAGNOSIS — N189 Chronic kidney disease, unspecified: Secondary | ICD-10-CM | POA: Diagnosis present

## 2011-12-29 DIAGNOSIS — G35 Multiple sclerosis: Secondary | ICD-10-CM | POA: Diagnosis present

## 2011-12-29 DIAGNOSIS — Z6841 Body Mass Index (BMI) 40.0 and over, adult: Secondary | ICD-10-CM

## 2011-12-29 DIAGNOSIS — E66813 Obesity, class 3: Secondary | ICD-10-CM

## 2011-12-29 DIAGNOSIS — Z79899 Other long term (current) drug therapy: Secondary | ICD-10-CM

## 2011-12-29 DIAGNOSIS — F329 Major depressive disorder, single episode, unspecified: Secondary | ICD-10-CM | POA: Diagnosis present

## 2011-12-29 DIAGNOSIS — J309 Allergic rhinitis, unspecified: Secondary | ICD-10-CM | POA: Diagnosis present

## 2011-12-29 HISTORY — PX: LAPAROSCOPIC GASTRIC BANDING: SHX1100

## 2011-12-29 LAB — CREATININE, SERUM
Creatinine, Ser: 0.64 mg/dL (ref 0.50–1.10)
GFR calc Af Amer: >90 mL/min (ref >90)
GFR calc non Af Amer: >90 mL/min (ref >90)

## 2011-12-29 LAB — CBC
HCT: 37.6 % (ref 36.0–46.0)
Hemoglobin: 12.5 g/dL (ref 12.0–15.0)
RBC: 4.42 MIL/uL (ref 3.87–5.11)
WBC: 7.8 10*3/uL (ref 4.0–10.5)

## 2011-12-29 SURGERY — GASTRIC BANDING, LAPAROSCOPIC
Anesthesia: General | Site: Abdomen | Wound class: Clean

## 2011-12-29 MED ORDER — MORPHINE SULFATE 2 MG/ML IJ SOLN
2.0000 mg | INTRAMUSCULAR | Status: DC | PRN
  Administered 2011-12-29: 4 mg via INTRAVENOUS
  Administered 2011-12-29 – 2011-12-30 (×3): 2 mg via INTRAVENOUS
  Administered 2011-12-30 (×2): 4 mg via INTRAVENOUS
  Filled 2011-12-29 (×2): qty 1
  Filled 2011-12-29 (×2): qty 2
  Filled 2011-12-29 (×3): qty 1
  Filled 2011-12-29: qty 2

## 2011-12-29 MED ORDER — BUPIVACAINE LIPOSOME 1.3 % IJ SUSP
20.0000 mL | Freq: Once | INTRAMUSCULAR | Status: AC
  Administered 2011-12-29: 20 mL
  Filled 2011-12-29: qty 20

## 2011-12-29 MED ORDER — ONDANSETRON HCL 4 MG/2ML IJ SOLN
INTRAMUSCULAR | Status: DC | PRN
  Administered 2011-12-29: 4 mg via INTRAVENOUS

## 2011-12-29 MED ORDER — DROPERIDOL 2.5 MG/ML IJ SOLN
INTRAMUSCULAR | Status: DC | PRN
  Administered 2011-12-29: 0.625 mg via INTRAVENOUS

## 2011-12-29 MED ORDER — ONDANSETRON HCL 4 MG/2ML IJ SOLN: 4.0000 mg | INTRAMUSCULAR | Status: DC | PRN

## 2011-12-29 MED ORDER — LACTATED RINGERS IV SOLN: INTRAVENOUS | Status: DC

## 2011-12-29 MED ORDER — UNJURY VANILLA POWDER: 2.0000 [oz_av] | Freq: Four times a day (QID) | ORAL | Status: DC

## 2011-12-29 MED ORDER — LACTATED RINGERS IV SOLN
INTRAVENOUS | Status: DC | PRN
  Administered 2011-12-29 (×3): via INTRAVENOUS

## 2011-12-29 MED ORDER — UNJURY CHICKEN SOUP POWDER: 2.0000 [oz_av] | Freq: Four times a day (QID) | ORAL | Status: DC

## 2011-12-29 MED ORDER — HEPARIN SODIUM (PORCINE) 5000 UNIT/ML IJ SOLN
INTRAMUSCULAR | Status: AC
  Administered 2011-12-29: 5000 [IU] via SUBCUTANEOUS
  Filled 2011-12-29: qty 1

## 2011-12-29 MED ORDER — HEPARIN SODIUM (PORCINE) 5000 UNIT/ML IJ SOLN
5000.0000 [IU] | Freq: Three times a day (TID) | INTRAMUSCULAR | Status: DC
  Administered 2011-12-29 – 2011-12-30 (×2): 5000 [IU] via SUBCUTANEOUS
  Filled 2011-12-29 (×5): qty 1

## 2011-12-29 MED ORDER — CEFOXITIN SODIUM-DEXTROSE 1-4 GM-% IV SOLR (PREMIX)
INTRAVENOUS | Status: AC
  Filled 2011-12-29: qty 100

## 2011-12-29 MED ORDER — PROPOFOL 10 MG/ML IV EMUL
INTRAVENOUS | Status: DC | PRN
  Administered 2011-12-29: 160 mg via INTRAVENOUS

## 2011-12-29 MED ORDER — SUCCINYLCHOLINE CHLORIDE 20 MG/ML IJ SOLN
INTRAMUSCULAR | Status: DC | PRN
  Administered 2011-12-29: 80 mg via INTRAVENOUS

## 2011-12-29 MED ORDER — SUFENTANIL CITRATE 50 MCG/ML IV SOLN
INTRAVENOUS | Status: DC | PRN
  Administered 2011-12-29: 10 ug via INTRAVENOUS
  Administered 2011-12-29: 5 ug via INTRAVENOUS
  Administered 2011-12-29: 10 ug via INTRAVENOUS
  Administered 2011-12-29: 5 ug via INTRAVENOUS
  Administered 2011-12-29: 20 ug via INTRAVENOUS
  Administered 2011-12-29 (×2): 10 ug via INTRAVENOUS
  Administered 2011-12-29 (×2): 5 ug via INTRAVENOUS

## 2011-12-29 MED ORDER — MEPERIDINE HCL 50 MG/ML IJ SOLN: 6.2500 mg | INTRAMUSCULAR | Status: DC | PRN

## 2011-12-29 MED ORDER — CISATRACURIUM BESYLATE (PF) 10 MG/5ML IV SOLN
INTRAVENOUS | Status: DC | PRN
  Administered 2011-12-29: 2 ug via INTRAVENOUS
  Administered 2011-12-29: 8 ug via INTRAVENOUS

## 2011-12-29 MED ORDER — DEXAMETHASONE SODIUM PHOSPHATE 10 MG/ML IJ SOLN
INTRAMUSCULAR | Status: DC | PRN
  Administered 2011-12-29: 10 mg via INTRAVENOUS

## 2011-12-29 MED ORDER — MIDAZOLAM HCL 5 MG/5ML IJ SOLN
INTRAMUSCULAR | Status: DC | PRN
  Administered 2011-12-29: 2 mg via INTRAVENOUS

## 2011-12-29 MED ORDER — HYDROMORPHONE HCL PF 1 MG/ML IJ SOLN: 0.2500 mg | INTRAMUSCULAR | Status: DC | PRN

## 2011-12-29 MED ORDER — NEOSTIGMINE METHYLSULFATE 1 MG/ML IJ SOLN
INTRAMUSCULAR | Status: DC | PRN
  Administered 2011-12-29: 5 mg via INTRAVENOUS

## 2011-12-29 MED ORDER — HEPARIN SODIUM (PORCINE) 5000 UNIT/ML IJ SOLN
5000.0000 [IU] | Freq: Once | INTRAMUSCULAR | Status: AC
  Administered 2011-12-29: 5000 [IU] via SUBCUTANEOUS

## 2011-12-29 MED ORDER — UNJURY CHOCOLATE CLASSIC POWDER
2.0000 [oz_av] | Freq: Four times a day (QID) | ORAL | Status: DC
  Administered 2011-12-30: 2 [oz_av] via ORAL

## 2011-12-29 MED ORDER — ACETAMINOPHEN 10 MG/ML IV SOLN
INTRAVENOUS | Status: DC | PRN
  Administered 2011-12-29: 1000 mg via INTRAVENOUS

## 2011-12-29 MED ORDER — DEXTROSE 5 % IV SOLN
2.0000 g | Freq: Once | INTRAVENOUS | Status: AC
  Administered 2011-12-29: 2 g via INTRAVENOUS
  Filled 2011-12-29: qty 2

## 2011-12-29 MED ORDER — GLYCOPYRROLATE 0.2 MG/ML IJ SOLN
INTRAMUSCULAR | Status: DC | PRN
  Administered 2011-12-29: .8 mg via INTRAVENOUS

## 2011-12-29 MED ORDER — SODIUM CHLORIDE 0.9 % IJ SOLN
INTRAMUSCULAR | Status: DC | PRN
  Administered 2011-12-29: 20 mL

## 2011-12-29 MED ORDER — ACETAMINOPHEN 10 MG/ML IV SOLN
INTRAVENOUS | Status: AC
  Filled 2011-12-29: qty 100

## 2011-12-29 MED ORDER — PROMETHAZINE HCL 25 MG/ML IJ SOLN: 6.2500 mg | INTRAMUSCULAR | Status: DC | PRN

## 2011-12-29 MED ORDER — OXYCODONE-ACETAMINOPHEN 5-325 MG/5ML PO SOLN
5.0000 mL | ORAL | Status: DC | PRN
  Administered 2011-12-30: 10 mL via ORAL
  Filled 2011-12-29: qty 10

## 2011-12-29 MED ORDER — LIDOCAINE HCL (CARDIAC) 20 MG/ML IV SOLN
INTRAVENOUS | Status: DC | PRN
  Administered 2011-12-29: 100 mg via INTRAVENOUS

## 2011-12-29 MED ORDER — ACETAMINOPHEN 160 MG/5ML PO SOLN: 650.0000 mg | ORAL | Status: DC | PRN

## 2011-12-29 SURGICAL SUPPLY — 68 items
APL SKNCLS STERI-STRIP NONHPOA (GAUZE/BANDAGES/DRESSINGS) ×2
BAND LAP 10.0 W/TUBES (Band) ×2 IMPLANT
BENZOIN TINCTURE PRP APPL 2/3 (GAUZE/BANDAGES/DRESSINGS) ×2 IMPLANT
BLADE HEX COATED 2.75 (ELECTRODE) ×2 IMPLANT
BLADE SURG 15 STRL LF DISP TIS (BLADE) ×1 IMPLANT
BLADE SURG 15 STRL SS (BLADE) ×2
CANISTER SUCTION 2500CC (MISCELLANEOUS) ×2 IMPLANT
CLOTH BEACON ORANGE TIMEOUT ST (SAFETY) ×2 IMPLANT
CLSR STERI-STRIP ANTIMIC 1/2X4 (GAUZE/BANDAGES/DRESSINGS) ×1 IMPLANT
COVER SURGICAL LIGHT HANDLE (MISCELLANEOUS) ×2 IMPLANT
DECANTER SPIKE VIAL GLASS SM (MISCELLANEOUS) ×4 IMPLANT
DEVICE SUT QUICK LOAD TK 5 (STAPLE) ×6 IMPLANT
DEVICE SUT TI-KNOT TK 5X26 (MISCELLANEOUS) ×2 IMPLANT
DEVICE SUTURE ENDOST 10MM (ENDOMECHANICALS) ×1 IMPLANT
DISSECTOR BLUNT TIP ENDO 5MM (MISCELLANEOUS) ×1 IMPLANT
DRAPE CAMERA CLOSED 9X96 (DRAPES) ×3 IMPLANT
ELECT REM PT RETURN 9FT ADLT (ELECTROSURGICAL) ×2
ELECTRODE REM PT RTRN 9FT ADLT (ELECTROSURGICAL) ×1 IMPLANT
GLOVE BIOGEL M 8.0 STRL (GLOVE) ×2 IMPLANT
GLOVE BIOGEL PI IND STRL 7.0 (GLOVE) ×1 IMPLANT
GLOVE BIOGEL PI INDICATOR 7.0 (GLOVE)
GOWN STRL NON-REIN LRG LVL3 (GOWN DISPOSABLE) ×2 IMPLANT
GOWN STRL REIN XL XLG (GOWN DISPOSABLE) ×4 IMPLANT
HOVERMATT SINGLE USE (MISCELLANEOUS) ×2 IMPLANT
KIT BASIN OR (CUSTOM PROCEDURE TRAY) ×2 IMPLANT
MESH HERNIA 1X4 RECT BARD (Mesh General) IMPLANT
MESH HERNIA BARD 1X4 (Mesh General) ×1 IMPLANT
NDL SPNL 22GX3.5 QUINCKE BK (NEEDLE) ×1 IMPLANT
NEEDLE SPNL 22GX3.5 QUINCKE BK (NEEDLE) ×2 IMPLANT
NS IRRIG 1000ML POUR BTL (IV SOLUTION) ×2 IMPLANT
PACK UNIVERSAL I (CUSTOM PROCEDURE TRAY) ×2 IMPLANT
PENCIL BUTTON HOLSTER BLD 10FT (ELECTRODE) ×2 IMPLANT
RELOAD ENDO STITCH 2.0 (ENDOMECHANICALS) ×2
RELOAD SUT SNGL STCH ABSRB 2-0 (ENDOMECHANICALS) IMPLANT
SCALPEL HARMONIC ACE (MISCELLANEOUS) IMPLANT
SET IRRIG TUBING LAPAROSCOPIC (IRRIGATION / IRRIGATOR) IMPLANT
SHEARS CURVED HARMONIC AC 45CM (MISCELLANEOUS) IMPLANT
SLEEVE ADV FIXATION 5X100MM (TROCAR) IMPLANT
SLEEVE Z-THREAD 5X100MM (TROCAR) IMPLANT
SOLUTION ANTI FOG 6CC (MISCELLANEOUS) ×2 IMPLANT
SPONGE GAUZE 4X4 12PLY (GAUZE/BANDAGES/DRESSINGS) ×2 IMPLANT
SPONGE LAP 18X18 X RAY DECT (DISPOSABLE) ×2 IMPLANT
STAPLER VISISTAT 35W (STAPLE) ×2 IMPLANT
STRIP CLOSURE SKIN 1/2X4 (GAUZE/BANDAGES/DRESSINGS) IMPLANT
SUT ETHIBOND 2 0 SH (SUTURE) ×6
SUT ETHIBOND 2 0 SH 36X2 (SUTURE) ×3 IMPLANT
SUT PROLENE 2 0 CT2 30 (SUTURE) ×2 IMPLANT
SUT RELOAD ENDO STITCH 2 48X1 (ENDOMECHANICALS) ×1
SUT SILK 0 (SUTURE)
SUT SILK 0 30XBRD TIE 6 (SUTURE) IMPLANT
SUT SURGIDAC NAB ES-9 0 48 120 (SUTURE) ×1 IMPLANT
SUT VIC AB 2-0 SH 27 (SUTURE)
SUT VIC AB 2-0 SH 27X BRD (SUTURE) IMPLANT
SUT VIC AB 4-0 SH 18 (SUTURE) ×2 IMPLANT
SUTURE RELOAD END STTCH 2 48X1 (ENDOMECHANICALS) ×1 IMPLANT
SYR 20CC LL (SYRINGE) ×2 IMPLANT
SYR 30ML LL (SYRINGE) ×2 IMPLANT
SYS KII OPTICAL ACCESS 15MM (TROCAR) ×2
SYSTEM KII OPTICAL ACCESS 15MM (TROCAR) ×1 IMPLANT
TAPE CLOTH SURG 4X10 WHT LF (GAUZE/BANDAGES/DRESSINGS) ×1 IMPLANT
TOWEL OR 17X26 10 PK STRL BLUE (TOWEL DISPOSABLE) ×4 IMPLANT
TROCAR ADV FIXATION 11X100MM (TROCAR) IMPLANT
TROCAR XCEL NON-BLD 11X100MML (ENDOMECHANICALS) ×2 IMPLANT
TROCAR Z-THREAD FIOS 11X100 BL (TROCAR) IMPLANT
TROCAR Z-THREAD FIOS 5X100MM (TROCAR) ×2 IMPLANT
TROCAR Z-THREAD SLEEVE 11X100 (TROCAR) IMPLANT
TUBE CALIBRATION LAPBAND (TUBING) ×2 IMPLANT
TUBING INSUFFLATION 10FT LAP (TUBING) ×2 IMPLANT

## 2011-12-29 NOTE — Preoperative (Signed)
Beta Blockers   Reason not to administer Beta Blockers:Not Applicable 

## 2011-12-29 NOTE — Anesthesia Preprocedure Evaluation (Addendum)
Anesthesia Evaluation  Patient identified by MRN, date of birth, ID band Patient awake    Reviewed: Allergy & Precautions, H&P , NPO status , Patient's Chart, lab work & pertinent test results  Airway Mallampati: III TM Distance: >3 FB Neck ROM: Full   Comment: Small chin Dental No notable dental hx. (+) Teeth Intact   Pulmonary neg pulmonary ROS,  breath sounds clear to auscultation  Pulmonary exam normal       Cardiovascular negative cardio ROS  Rhythm:Regular Rate:Normal     Neuro/Psych  Neuromuscular disease (Multiple Sclerosis) negative neurological ROS  negative psych ROS   GI/Hepatic negative GI ROS, Neg liver ROS,   Endo/Other  negative endocrine ROSMorbid obesity  Renal/GU negative Renal ROS  negative genitourinary   Musculoskeletal negative musculoskeletal ROS (+)   Abdominal   Peds negative pediatric ROS (+)  Hematology negative hematology ROS (+)   Anesthesia Other Findings   Reproductive/Obstetrics negative OB ROS                          Anesthesia Physical Anesthesia Plan  ASA: II  Anesthesia Plan: General   Post-op Pain Management:    Induction: Intravenous  Airway Management Planned: Oral ETT  Additional Equipment:   Intra-op Plan:   Post-operative Plan: Extubation in OR  Informed Consent: I have reviewed the patients History and Physical, chart, labs and discussed the procedure including the risks, benefits and alternatives for the proposed anesthesia with the patient or authorized representative who has indicated his/her understanding and acceptance.   Dental advisory given  Plan Discussed with: CRNA  Anesthesia Plan Comments:         Anesthesia Quick Evaluation

## 2011-12-29 NOTE — H&P (Signed)
Chief Complaint: Obesity, BMI of 41.8  History of Present Illness: Samantha Tyler is an 41 y.o. female who has had lifelong issues with her weight. She has been to one of our sessions and is interested in having a laparoscopic adjustable gastric band. She has never had any abdominal surgery. She has tried multiple weight loss attempts with limited success. Her most recent issues been multiple sclerosis which presented as itching, numbness and tingling of her back when she got home from the beach a few years ago. She was initially diagnosed and treated at [facility] but now her daughter from that institution is with [facility] out in advance. Before actually having the pain and she will see the approval of her neurologist.  She went to proceed however the workup and we will begin its journey.  Past Medical History   Diagnosis  Date   .  Hyperlipidemia    .  Depression    .  Chronic kidney disease    .  History of colon polyps    .  Multiple sclerosis    .  Fatigue    .  Hernia    .  Colon polyps    .  History of colon polyps    .  Contact lens/glasses fitting     Past Surgical History   Procedure  Date   .  Hernia repair  02/1995     inguinal    Current Outpatient Prescriptions   Medication  Sig  Dispense  Refill   .  natalizumab (TYSABRI) 300 MG/15ML injection  Inject into the vein every 30 (thirty) days.     Marland Kitchen  atorvastatin (LIPITOR) 80 MG tablet  80 mg daily.     Marland Kitchen  buPROPion (WELLBUTRIN XL) 300 MG 24 hr tablet  300 mg daily.     .  citalopram (CELEXA) 40 MG tablet  daily.     Marland Kitchen  spironolactone (ALDACTONE) 100 MG tablet      Review of patient's allergies indicates no known allergies.  Family History   Problem  Relation  Age of Onset   .  Hyperlipidemia  Mother    .  Hypertension  Mother    .  Hypothyroidism  Mother    .  Hyperlipidemia  Father    .  Hypertension  Father    Social History: reports that she has never smoked. She has never used smokeless tobacco. She reports that  she does not drink alcohol or use illicit drugs.  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 She also had a right superficial phlebitis treated with compresses and Mobic.  Physical Exam:  Blood pressure 120/68, pulse 68, temperature 97.8 F (36.6 C), temperature source Temporal, resp. rate 18, height 5\' 9"  (1.753 m), weight 282 lb 12.8 oz (128.277 kg).  Body mass index is 41.76 kg/(m^2).  Gen: WDWN white female NAD  Neurological: Alert and oriented to person, place, and time. Motor and sensory function is grossly intact  Head: Normocephalic and atraumatic.  Eyes: Conjunctivae are normal. Pupils are equal, round, and reactive to light. No scleral icterus.  Neck: Normal range of motion. Neck supple. No tracheal deviation or thyromegaly present.  Cardiovascular: SR without murmurs or gallops. No carotid bruits  Respiratory: Effort normal. No respiratory distress. No chest wall tenderness. Breath sounds  normal. No wheezes, rales or rhonchi.  Abdomen: nontender no surgery  GU:  Musculoskeletal: Normal range of motion. Extremities are nontender. No cyanosis, edema or clubbing noted Lymphadenopathy: No cervical, preauricular, postauricular or axillary adenopathy is present Skin: Skin is warm and dry. No rash noted. No diaphoresis. No erythema. No pallor. Pscyh: Normal mood and affect. Behavior is normal. Judgment and thought content normal.  LABORATORY RESULTS:  No results found for this or any previous visit (from the past 48 hour(s)).  RADIOLOGY RESULTS:  No results found.  Problem List:  Patient Active Problem List   Diagnoses   .  HYPERLIPIDEMIA   .  HYPERSOMNIA, PERSISTENT   .  ALLERGIC RHINITIS   .  MS (multiple sclerosis)   .  Obesity, Class III, BMI 40-49.9 (morbid obesity)   Assessment & Plan:    Morbid obesity in a patient with a BMI of 42 and is interested in a LAP-BAND. The LAP-BAND booklet has been given to her. Her UGI showed a small hiatus hernia but she has no GER symptoms.  Matt B. Daphine Deutscher, MD, [facility]  [phone] beeper  [phone]

## 2011-12-29 NOTE — Transfer of Care (Signed)
Immediate Anesthesia Transfer of Care Note  Patient: Samantha Tyler  Procedure(s) Performed: Procedure(s) (LRB): LAPAROSCOPIC GASTRIC BANDING (N/A)  Patient Location: PACU  Anesthesia Type: General  Level of Consciousness: awake, alert  and sedated  Airway & Oxygen Therapy: Patient Spontanous Breathing and Patient connected to face mask oxygen  Post-op Assessment: Report given to PACU RN and Post -op Vital signs reviewed and stable  Post vital signs: Reviewed and stable  Complications: No apparent anesthesia complications

## 2011-12-29 NOTE — Op Note (Signed)
@  DATE@ Surgeon: Wenda Low, MD, FACS Asst:  Kalman Jewels, MD, FACS  Procedure: Laparoscopic adjustable gastric banding with APS, 1 suture post hiatus hernia repair  Anes:  General  EBL:  Minimal  Description of Procedure  The patient was taken to OR # 1 and given general anesthesia.  After a prep with PCMX the patient was draped and a timeout performed.  Access to the abdomen was achieved with a 0 degree Optiview technique through the left upper quadrant.    Adhesions were minimal.  Ports were placed to the the right of the midline including a 15 trocar in  the right upper quadrant placed obliquely.  The Satira Mccallum was used to retract the left lateral segment and the peritoneum was incised along the left crus.  The EJ junction as assessed for a hiatus hernia and the UGI had shown a small HH.  A small dimple was noted and the hiatus was dissected posteriorally.  A single surgidec was placed with the Endostitch and secured with a ty knot.  The pars flaccida technique was utilized to insert the blunt "finger " dissector from right to left behind the stomach.    The lapband APS  Had been previously flushed and was inserted through the 15 trocar.  It was placed in the blunt dissector tip and pulled around behind the stomach. It was secured over the balloon tipped tube.    The band was plicated with 3 sutures of Surgidec placed with a free needle and secured with ty knots..  The tubing was brought out through the lower incision on the right and connected to the port which had mesh sewn onto the back and was placed into the subcutaneous pocket.   The patient was taken to the PACU in stable condition.    Matt B. Daphine Deutscher, MD, [facility], [place] [phone]

## 2011-12-29 NOTE — Anesthesia Procedure Notes (Signed)
Date/Time: 12/29/2011 12:06 PM Performed by: Leroy Libman L Patient Re-evaluated:Patient Re-evaluated prior to inductionOxygen Delivery Method: Circle system utilized Preoxygenation: Pre-oxygenation with 100% oxygen Intubation Type: IV induction Ventilation: Mask ventilation without difficulty and Oral airway inserted - appropriate to patient size Laryngoscope Size: Hyacinth Meeker and 2 Grade View: Grade I Tube type: Oral Tube size: 7.5 mm Number of attempts: 1 Airway Equipment and Method: Stylet Placement Confirmation: ETT inserted through vocal cords under direct vision,  breath sounds checked- equal and bilateral and positive ETCO2 Secured at: 22 cm Tube secured with: Tape Dental Injury: Teeth and Oropharynx as per pre-operative assessment

## 2011-12-30 ENCOUNTER — Inpatient Hospital Stay (HOSPITAL_COMMUNITY): Payer: 59

## 2011-12-30 ENCOUNTER — Encounter (HOSPITAL_COMMUNITY): Payer: Self-pay | Admitting: Surgery

## 2011-12-30 DIAGNOSIS — Z9889 Other specified postprocedural states: Secondary | ICD-10-CM

## 2011-12-30 LAB — CBC
Hemoglobin: 12 g/dL (ref 12.0–15.0)
MCH: 28.3 pg (ref 26.0–34.0)
Platelets: 220 10*3/uL (ref 150–400)
RBC: 4.24 MIL/uL (ref 3.87–5.11)
WBC: 9.4 10*3/uL (ref 4.0–10.5)

## 2011-12-30 LAB — DIFFERENTIAL
Basophils Relative: 0 % (ref 0–1)
Eosinophils Absolute: 0 10*3/uL (ref 0.0–0.7)
Lymphs Abs: 1.1 10*3/uL (ref 0.7–4.0)
Monocytes Relative: 6 % (ref 3–12)
Neutro Abs: 7.7 10*3/uL (ref 1.7–7.7)
Neutrophils Relative %: 82 % — ABNORMAL HIGH (ref 43–77)

## 2011-12-30 MED ORDER — OXYCODONE-ACETAMINOPHEN 5-325 MG/5ML PO SOLN: 5.0000 mL | ORAL | Status: DC | PRN

## 2011-12-30 MED ORDER — IOHEXOL 300 MG/ML  SOLN: 20.0000 mL | Freq: Once | INTRAMUSCULAR | Status: DC | PRN

## 2011-12-30 NOTE — Progress Notes (Signed)
*  PRELIMINARY RESULTS* Vascular Ultrasound Lower extremity venous duplex has been completed.  Preliminary findings: Bilaterally no evidence of DVT or baker's cyst.  Farrel Demark, RDMS 12/30/2011, 9:11 AM

## 2011-12-30 NOTE — Discharge Summary (Signed)
Physician Discharge Summary  Patient ID: Samantha Tyler MRN: [id number] DOB/AGE: 1971-05-06 41 y.o.  Admit date: 12/29/2011 Discharge date: 12/30/2011  Admission Diagnoses:  obesity  Discharge Diagnoses:  same  Active Problems:  * No active hospital problems. *    Surgery:  lapband aps + hh repair  Discharged Condition: improved  Hospital Course:   Had lapband and 1 suture post closure of HH  Consults: none  Significant Diagnostic Studies: none    Discharge Exam: Blood pressure 147/75, pulse 100, temperature 99 F (37.2 C), temperature source Oral, resp. rate 18, height 5\' 9"  (1.753 m), weight 258 lb (117.028 kg), SpO2 96.00%. Minimal pain.  Awaiting UGI.    Disposition: 06-Home-Health Care Svc  Discharge Orders    Future Appointments: Provider: Department: Dept Phone: Center:   01/12/2012 4:00 PM [facility] [phone] N[facility]   01/21/2012 9:20 AM Valarie Merino, MD [facility] [phone] None     Future Orders Please Complete By Expires   Diet - low sodium heart healthy      Increase activity slowly        Medication List  As of 12/30/2011  9:25 AM   TAKE these medications         atorvastatin 80 MG tablet   Commonly known as: LIPITOR   Take 80 mg by mouth at bedtime.      diphenhydrAMINE 25 mg capsule   Commonly known as: BENADRYL   Take 50 mg by mouth at bedtime. Sleep        ibuprofen 200 MG tablet   Commonly known as: ADVIL,MOTRIN   Take 400-800 mg by mouth every 6 (six) hours as needed. Pain      Melatonin 5 MG Tabs   Take 5 mg by mouth at bedtime. Sleep        methylphenidate 20 MG tablet   Commonly known as: RITALIN   Take 20 mg by mouth daily as needed. attention deficit disorder      oxyCODONE-acetaminophen 5-325 MG/5ML solution   Commonly known as: ROXICET   Take 5-10 mLs by mouth every 4 (four) hours as needed.           Follow-up Information    Follow up with Valarie Merino, MD. (previously  made appt)    Contact information:   [facility] [address] [place] [address] [phone]          Signed: Valarie Merino 12/30/2011, 9:25 AM

## 2011-12-30 NOTE — Progress Notes (Signed)
Pt alert and oriented; VSS; tolerating sips of water well; awaiting UGI; doppler study negative; denies any nausea or vomiting; +flatus; no BM; voiding without difficulty; ambulating in hallways well; c/o abdominal discomfort with relief from prn pain meds; pt already has follow up appts with [facility] and [facility]; aware of BELT program and support group; dicharge instructions reviewed and pt verbalized understanding of.  ADJUSTABLE GASTRIC BAND DISCHARGE INSTRUCTIONS  Drs. Fredrik Rigger, [place]oxworth, Wilson, and [place] Call if you have any problems.   Call [phone] and ask for the surgeon on call.    If you need immediate assistance come to the ER at [facility]. Tell the ER personnel that you are a new post-op gastric banding patient. Signs and symptoms to report:   Severe vomiting or nausea. If you cannot tolerate clear liquids for longer than 1 day, you need to call your surgeon.    Abdominal pain which does not get better after taking your pain medication   Fever greater than 101 F degree   Difficulty breathing   Chest pain    Redness, swelling, drainage, or foul odor at incision sites    If your incisions open or pull apart   Swelling or pain in calf (lower leg)   Diarrhea, frequent watery, uncontrolled bowel movements.   Constipation, (no bowel movements for 3 days) if this occurs, Take Milk of Magnesia, 2 tablespoons by mouth, 3 times a day for 2 days if needed.  Call your doctor if constipation continues. Stop taking Milk of Magnesia once you have had a bowel movement. You may also use Miralax according to the label instructions.   Anything you consider "abnormal for you".   Normal side effects after Surgery:   Unable to sleep at night or concentrate   Irritability   Being tearful (crying) or depressed   These are common complaints, possibly related to your anesthesia, stress of surgery and change in lifestyle, that usually go away a few weeks after surgery.  If these feelings continue,  call your medical doctor.  Wound Care You may have surgical glue, steri-strips, or staples over your incisions after surgery.  Surgical glue:  Looks like a clear film over your incisions and will wear off gradually. Steri-strips: Strips of tape over your incisions. You may notice a yellowish color on the skin underneath the steri-strips. This is a substance used to make the steri-strips stick better. Do not pull the steri-strips off - let them fall off. Staples: Cherlynn Polo may be removed before you leave the hospital. If you go home with staples, call [facility] [place] [facility] [phone]) for an appointment with your surgeon's nurse to have staples removed in 7 - 10 days. Showering: You may shower two days after your surgery unless otherwise instructed by your surgeon. Wash gently around wounds with warm soapy water, rinse well, and gently pat dry.  If you have a drain, you may need someone to hold this while you shower. Avoid tub baths until staples are removed and incisions are healed.    Medications   Medications should be liquid or crushed if larger than the size of a dime.  Extended release pills should not be crushed.   Depending on the size and number of medications you take, you may need to stagger/change the time you take your medications so that you do not over-fill your pouch.    Make sure you follow-up with your primary care physician to make medication adjustments needed during rapid weight loss and life-style adjustment.  If you are diabetic, follow up with the doctor that prescribes your diabetes medication(s) within one week after surgery and check your blood sugar regularly.   Do not drive while taking narcotics!   Do not take acetaminophen (Tylenol) and Roxicet or Lortab Elixir at the same time since these pain medications contain acetaminophen.  Diet at home: (First 2 Weeks)  You will see the nutritionist two weeks after your surgery. She will advance your diet if you are  tolerating liquids well. Once at home, if you have severe vomiting or nausea and cannot tolerate clear liquids lasting longer than 1 day, call your surgeon.  For Same Day Surgery Discharge Patients: The day of surgery drink water only: 2 ounces every 4 hours. If you are tolerating water, begin drinking your high protein shake the next morning. For Overnight Stay Patients: Begin high protein shake 2 ounces every 3 hours, 5 - 6 times per day.  Gradually increase the amount you drink as tolerated.  You may find it easier to slowly sip shakes throughout the day.  It is important to get your proteins in first.   Protein Shake   Drink at least 2 ounces of shake 5-6 times per day   Each serving of protein shakes should have a minimum of 15 grams of protein and no more than 5 grams of carbohydrate    Increase the amount of protein shake you drink as tolerated   Protein powder may be added to fluids such as non-fat milk or Lactaid milk (limit to 20 grams added protein powder per serving   The initial goal is to drink at least 8 ounces of protein shake/drink per day (or as directed by the nutritionist). Some examples of protein shakes are [employer], [employer], EAS Edge HP, and Unjury. Hydration   Gradually increase the amount of water and other liquids as tolerated (See Acceptable Fluids)   Gradually increase the amount of protein shake as tolerated     Sip fluids slowly and throughout the day   May use Sugar substitutes, use sparingly (limit to 6 - 8 packets per day).  Your fluid goal is 64 ounces of fluid daily. It may take a few weeks to build up to this.         32 oz (or more) should be clear liquids and 32 oz (or more) should be full liquids.         Liquids should not contain sugar, caffeine, or carbonation!  Acceptable Fluids Clear Liquids:   Water or Sugar-free flavored water, Fruit H2O   Decaffeinated coffee or tea (sugar-free)   Crystal Lite, Wyler's Lite, Minute Maid Lite    Sugar-free Jell-O   Bouillon or broth   Sugar-free Popsicle:   *Less than 20 calories each; Limit 1 per day   Full Liquids:              Protein Shakes/Drinks + 2 choices per day of other full liquids shown below.    Other full liquids must be: No more than 12 grams of Carbs per serving,  No more than 3 grams of Fat per serving   Strained low-fat cream soup   Non-Fat milk   Fat-free Lactaid Milk   Sugar-free yogurt (Dannon Lite & Fit) Vitamins and Minerals (Start 1 day after surgery unless otherwise directed)   1 Chewable Multivitamin / Multimineral Supplement (i.e. Centrum for Adults)   Chewable Calcium Citrate with Vitamin D-3. Take 1500 mg each day.           (  Example: 3 Chewable Calcium Plus 600 with Vitamin D-3 can be found at [facility])           Do not mix multivitamins containing iron with calcium supplements; take 2 hours   apart   Do not substitute Tums (calcium carbonate) for your calcium   Menstruating women and those at risk for anemia may need extra iron. Talk with your doctor to see if you need additional iron.     If you need extra iron:  Total daily Iron recommendations (including Vitamins) = 50 - 100 mg Iron/day Do not stop taking or change any vitamins or minerals until you talk to your nutritionist or surgeon. Your nutritionist and / or physician must approve all vitamin and mineral supplements. Exercise For maximum success, begin exercising as soon as your doctor recommends. Make sure your physician approves any physical activity.   Depending on fitness level, begin with a simple walking program   Walk 5-15 minutes each day, 7 days per week.    Slowly increase until you are walking 30-45 minutes per day   Consider joining our BELT program. [phone] or email [email or web address]@[employer] .edu Things to remember:   You may have sexual relations when you feel comfortable. It is VERY important for female patients to use a reliable birth control method. Fertility often increases after  surgery. Do not get pregnant for at least 18 months.   It is very important to keep all follow up appointments with your surgeon, nutritionist, primary care physician, and behavioral health practitioner. After the first year, please follow up with your bariatric surgeon at least once a year in order to maintain best weight loss results.  [facility] [place] [facility]: [phone] [facility]: [phone]   Free counseling is available for you and your family through collaboration between [facility] and [address]. Please call [phone] and leave a message.    Consider purchasing a medical alert bracelet that says you had lap-band surgery.    The [facility] has a free Bariatric Surgery Support Group that meets monthly, the 3rd Thursday, 6 pm, Classroom #1, [employer]. You may register online at [email or web address], but registration is not necessary. Select Classes and Support Groups, Bariatric Surgery, or Call [phone]   Do not return to work or drive until cleared by your surgeon   Use your CPAP when sleeping if applicable   Do not lift anything greater than ten pounds for at least two weeks.   You will probably have your first fill (fluid added to your band) 6 weeks after surgery  Talmadge Chad, RN Bariatric Nurse Coordinator

## 2011-12-30 NOTE — Progress Notes (Signed)
UGI results called to Dr. Daphine Deutscher; orders received to begin POD #1 diet; Colin Mulders, RN aware of results and orders. Talmadge Chad, RN Bariatric Nurse Coordinator

## 2011-12-30 NOTE — Discharge Instructions (Signed)
Gastric Banding Surgery Care After Refer to this sheet in the next few weeks. These discharge instructions provide you with general information on caring for yourself after you leave the hospital. Your caregiver may also give you specific instructions. Your treatment has been planned according to the most current medical practices available, but unavoidable complications sometimes occur. If you have any problems or questions after discharge, call your caregiver. HOME CARE INSTRUCTIONS  Activity  Take frequent walks throughout the day. This will help to prevent blood clots. Do not sit for longer than 45 minutes to 1 hour while awake for 4 to 6 weeks after surgery.   Continue to do coughing and deep breathing exercises once you get home. This will help to prevent pneumonia.   Do not do strenuous activities, such as heavy lifting, pushing, or pulling, until after your follow-up visit with your caregiver. Do not lift anything heavier than 10 lb (4.5 kg).   Talk with your caregiver about when you may return to work and your exercise routine.   Do not drive while taking prescription pain medicine.  Nutrition  It is very important that you drink at least 80 oz (2,400 mL) of fluid a day.   You should stay on a clear liquid diet until your follow-up visit with your caregiver. Keep sugar-free, clear liquid items on hand, including:   Tea: hot or cold. Drink only decaffeinated for the first month.   Broths: clear beef, chicken, vegetable.   Others: water, sugar-free frozen ice pops, flavored water, gelatin (after 1 week).   Do not consume caffeine for 1 month. Large amounts of caffeine can cause dehydration.   A dietician may also give you specific instructions.   Follow your caregiver's recommendations about vitamins and protein requirements after surgery.  Hygiene  You may shower and wash your hair 2 days after surgery. Pat incisions dry. Do not rub incisions with a washcloth or towel.    Follow your caregiver's recommendations about baths and pools following surgery.  Pain control  If a prescription medicine was given, follow your caregiver's directions.   You may feel some gas pain caused by the carbon dioxide used to inflate your abdomen during surgery. This pain can be felt in your chest, shoulder, back, or abdominal area. Moving around often is advised.  Incision care You may have 4 or more small incisions. They are closed with skin adhesive strips and have a clear plastic covering over them. You may remove your dressings the number of days directed by your caregiver after surgery. Check your incisions and surrounding area daily for any redness, swelling, discoloration, fluid (drainage), or bleeding. Dark red, dried blood may appear under these coverings. This is normal. SEEK MEDICAL CARE IF:   You develop persistent nausea and vomiting.   You have pain and discomfort with swallowing.   You have pain, swelling, or warmth in the lower extremities.   You have an oral temperature above 102 F (38.9 C).   You develop chills.   Your incision sites look red, swollen, or have drainage.   Your stool is black, tarry, or maroon in color.   You are lightheaded when standing.   You have any questions or concerns.  SEEK IMMEDIATE MEDICAL CARE IF:   You have chest pain.   You have severe calf pain or pain not relieved by medicine.   You develop shortness of breath or difficulty breathing.   You feel confused.   You have slurred speech.     You suddenly feel weak.  MAKE SURE YOU:   Understand these instructions.   Will watch your condition.   Will get help right away if you are not doing well or get worse.  Document Released: 03/24/2004 Document Revised: 07/30/2011 Document Reviewed: 12/31/2009 ExitCare Patient Information 2012 [employer]. 

## 2012-01-02 NOTE — Anesthesia Postprocedure Evaluation (Signed)
  Anesthesia Post-op Note  Patient: Samantha Tyler  Procedure(s) Performed: Procedure(s) (LRB): LAPAROSCOPIC GASTRIC BANDING (N/A)  Patient Location: PACU  Anesthesia Type: General  Level of Consciousness: awake and alert   Airway and Oxygen Therapy: Patient Spontanous Breathing  Post-op Pain: mild  Post-op Assessment: Post-op Vital signs reviewed, Patient's Cardiovascular Status Stable, Respiratory Function Stable, Patent Airway and No signs of Nausea or vomiting  Post-op Vital Signs: stable  Complications: No apparent anesthesia complications

## 2012-01-12 ENCOUNTER — Encounter: Payer: 59 | Attending: Surgery | Admitting: *Deleted

## 2012-01-12 VITALS — Ht 69.0 in | Wt 252.8 lb

## 2012-01-12 DIAGNOSIS — Z01818 Encounter for other preprocedural examination: Secondary | ICD-10-CM | POA: Insufficient documentation

## 2012-01-12 DIAGNOSIS — Z713 Dietary counseling and surveillance: Secondary | ICD-10-CM | POA: Insufficient documentation

## 2012-01-13 ENCOUNTER — Encounter: Payer: Self-pay | Admitting: *Deleted

## 2012-01-13 NOTE — Progress Notes (Signed)
  Bariatric Class:  Appt start time: 1600 end time:  1700.  2 Week Post-Operative Nutrition Class  Patient was seen on 01/12/12 for Post-[facility]ative [facility]   Surgery date: 12/29/11 Surgery type: LAGB Start Weight at [facility]: 282.6 lbs   Weight today: 252.8 lb  Weight change: 29.8 lbs Total weight lost: 29.8 lbs BMI: 37.3 kg/m^2  The following the learning objective met the patient during this course:   Identifies Phase 3A (Soft, High Proteins) Dietary Goals and will begin from 2 weeks post-operatively to 2 months post-operatively   Identifies appropriate sources of fluids and proteins   States protein recommendations and appropriate sources post-operatively  Identifies the need for appropriate texture modifications, mastication, and bite sizes when consuming solids  Identifies appropriate multivitamin and calcium sources post-operatively  Describes the need for physical activity post-operatively and will follow MD recommendations  States when to call healthcare provider regarding medication questions or post-operative complications  Handouts given during class include:  Phase 3A: Soft, High Protein Diet Handout  Band Fill Guidelines Handout  Follow-Up Plan: Patient will follow-up at [facility] in 6 weeks for 2 months post-op nutrition visit for diet advancement per MD.

## 2012-01-13 NOTE — Patient Instructions (Signed)
Patient to follow Phase 3A-Soft, High Protein Diet and follow-up at [facility] in 6 weeks for 2 months post-op nutrition visit for diet advancement. 

## 2012-01-21 ENCOUNTER — Encounter (INDEPENDENT_AMBULATORY_CARE_PROVIDER_SITE_OTHER): Payer: Self-pay | Admitting: Surgery

## 2012-01-21 ENCOUNTER — Telehealth (INDEPENDENT_AMBULATORY_CARE_PROVIDER_SITE_OTHER): Payer: Self-pay | Admitting: General Surgery

## 2012-01-21 ENCOUNTER — Ambulatory Visit (INDEPENDENT_AMBULATORY_CARE_PROVIDER_SITE_OTHER): Payer: 59 | Admitting: Surgery

## 2012-01-21 VITALS — BP 118/64 | HR 68 | Temp 97.3°F | Resp 14 | Ht 69.0 in | Wt 244.5 lb

## 2012-01-21 DIAGNOSIS — Z9884 Bariatric surgery status: Secondary | ICD-10-CM

## 2012-01-21 NOTE — Patient Instructions (Signed)
Stay on low carb diet 

## 2012-01-21 NOTE — Progress Notes (Signed)
Three weeks out from Lapband APS and one suture hiatus hernia repair.  Incisions are OK.  Return 3 weeks for first bandfill.

## 2012-01-21 NOTE — Telephone Encounter (Signed)
Left message on patient cell to advise her to call us back pertaining to future lap band appointment. Set for 02/11/12 at 10:45 with lap band clinic.

## 2012-01-27 ENCOUNTER — Encounter (INDEPENDENT_AMBULATORY_CARE_PROVIDER_SITE_OTHER): Payer: 59 | Admitting: Surgery

## 2012-02-11 ENCOUNTER — Encounter (INDEPENDENT_AMBULATORY_CARE_PROVIDER_SITE_OTHER): Payer: 59

## 2012-02-18 ENCOUNTER — Encounter (INDEPENDENT_AMBULATORY_CARE_PROVIDER_SITE_OTHER): Payer: Self-pay | Admitting: Physician Assistant

## 2012-02-18 ENCOUNTER — Ambulatory Visit (INDEPENDENT_AMBULATORY_CARE_PROVIDER_SITE_OTHER): Payer: 59 | Admitting: Physician Assistant

## 2012-02-18 NOTE — Progress Notes (Signed)
  HISTORY: Samantha Tyler is a 41 y.o.female who received an AP-Standard lap-band in May 2013 by Dr. Daphine Deutscher. She comes in having been seen one month ago and she has lost an impressive 17 lbs. Total weight loss since surgery is almost 36 lbs. She notices a slight increase in hunger but she's very satisfied with small portions of food. She denies regurgitation, reflux or night cough. She has noticed a globus sensation a couple of hours after eating for the past week or so but no other difficulties. She's walking two to three times a week.  VITAL SIGNS: Filed Vitals:   02/18/12 0823  BP: 118/76  Pulse: 68  Temp: 97.9 F (36.6 C)  Resp: 14    PHYSICAL EXAM: Physical exam reveals a very well-appearing 41 y.o.female in no apparent distress Neurologic: Awake, alert, oriented Psych: Bright affect, conversant Respiratory: Breathing even and unlabored. No stridor or wheezing Extremities: Atraumatic, good range of motion. Skin: Warm, Dry, no rashes Musculoskeletal: Normal gait, Joints normal  ASSESMENT: 41 y.o.  female  s/p AP-Standard lap-band.   PLAN: As she's in the green zone, we deferred an adjustment. The globus sensation she's experiencing is puzzling, however. There's no clinical evidence of obstruction. As such, we decided to hold off on an adjustment this time. I asked her to return in one month but to call us sooner if she began having dysphagia or increased hunger or portion sizes. If the globus sensation worsens, I'll schedule her for an upper GI.

## 2012-02-18 NOTE — Patient Instructions (Signed)
Return in one month or sooner if needed - call if you have increasing hunger, increasing portion sizes or difficulty swallowing.

## 2012-02-19 ENCOUNTER — Ambulatory Visit: Payer: 59 | Admitting: *Deleted

## 2012-03-08 ENCOUNTER — Telehealth (INDEPENDENT_AMBULATORY_CARE_PROVIDER_SITE_OTHER): Payer: Self-pay | Admitting: General Surgery

## 2012-03-08 NOTE — Telephone Encounter (Signed)
Patient c/o the feeling of something being stuck in her throat, she was told by Mardelle Matte "it could be from eating to fast or to much" offered her an appt this week to see Mardelle Matte, she is unable to make it in this week. She is questioning if she should have a new swallow study. The patient states this feeling is sporadic and she is asymptomatic today.

## 2012-03-08 NOTE — Telephone Encounter (Signed)
Message copied by Latricia Heft on Tue Mar 08, 2012 12:13 PM ------      Message from: Zacarias Pontes      Created: Tue Mar 08, 2012 11:31 AM       Pt has questions from her May surgery with Dr MM ,pls give her a call at [phone]

## 2012-03-14 ENCOUNTER — Telehealth (INDEPENDENT_AMBULATORY_CARE_PROVIDER_SITE_OTHER): Payer: Self-pay | Admitting: General Surgery

## 2012-03-14 DIAGNOSIS — R112 Nausea with vomiting, unspecified: Secondary | ICD-10-CM

## 2012-03-14 DIAGNOSIS — R1310 Dysphagia, unspecified: Secondary | ICD-10-CM

## 2012-03-14 NOTE — Telephone Encounter (Signed)
Per Dr Daphine Deutscher schedule patient for an UGI

## 2012-03-14 NOTE — Telephone Encounter (Signed)
Pt called to state she 'feels like something is stuck in her throat all the time."  She had lap band surgery in May 2013, but has not yet had a fill.  Saw Andy at lap band clinic, and mentioned this problem, but it was not a bad.  She is frequently nauseated and has vomited once (after protein shake.)  Please advise.

## 2012-03-16 ENCOUNTER — Ambulatory Visit (HOSPITAL_COMMUNITY)
Admission: RE | Admit: 2012-03-16 | Discharge: 2012-03-16 | Disposition: A | Discharge status: Home / Self Care | Payer: 59 | Source: Ambulatory Visit | Attending: Surgery | Admitting: Surgery

## 2012-03-16 DIAGNOSIS — R6889 Other general symptoms and signs: Secondary | ICD-10-CM | POA: Insufficient documentation

## 2012-03-16 DIAGNOSIS — R1310 Dysphagia, unspecified: Secondary | ICD-10-CM

## 2012-03-16 DIAGNOSIS — Z9884 Bariatric surgery status: Secondary | ICD-10-CM | POA: Insufficient documentation

## 2012-03-16 DIAGNOSIS — R112 Nausea with vomiting, unspecified: Secondary | ICD-10-CM

## 2012-03-17 ENCOUNTER — Encounter: Payer: Self-pay | Admitting: *Deleted

## 2012-03-17 ENCOUNTER — Encounter: Payer: 59 | Attending: Surgery | Admitting: *Deleted

## 2012-03-17 VITALS — Ht 69.0 in | Wt 212.5 lb

## 2012-03-17 DIAGNOSIS — Z01818 Encounter for other preprocedural examination: Secondary | ICD-10-CM | POA: Insufficient documentation

## 2012-03-17 DIAGNOSIS — Z713 Dietary counseling and surveillance: Secondary | ICD-10-CM | POA: Insufficient documentation

## 2012-03-17 NOTE — Progress Notes (Addendum)
Follow-up visit:  8 Weeks Post-Operative LAGB Surgery  Medical Nutrition Therapy:  Appt start time: 0830 end time:  0900.  Primary concerns today: Post-operative Bariatric Surgery Nutrition Management. Samantha Tyler returns today for f/u very upset and reports food/protein shakes feel "stuck in my throat" starting several hours after consuming. Has decreased all PO intake d/t fear and resulting nausea after eating. States the nausea started on 02/12/12 and is becoming progressively worse.  I feel as though this is likely psychological in nature, though she states "I just want the band out". Discussed ways to increase protein and fluid intake, as well as giving it more time to see if things settle down. Will see her again in 4 weeks or sooner if needed.   Surgery date: 12/29/11 Surgery type: LAGB Start Weight at [facility]: 282.6 lbs   Weight today: 212.5 lb  Weight change: 40.3 lbs Total weight lost: 70.1 lbs BMI: 31.4 kg/m^2  Weight goal: 180 lbs % Weight goal met: 68%  TANITA  BODY COMP RESULTS  03/17/12   %Fat 45.5%   Fat Mass (lbs) 96.5   Fat Free Mass (lbs) 116.0   Total Body Water (lbs) 85.0   Fluid intake: ~45-50 oz Estimated total protein intake: 50-70 g  Medications: See medication list. Supplementation: Had d/c'd previously thinking it would help nausea, but did not. Recently resumed taking regularly.  Using straws: No Drinking while eating: No Hair loss: No Carbonated beverages: No N/V/D/C: Nausea - becoming progressively worse since 02/12/12; Constipation reported; likely d/t decreased fluids Last Lap-Band fill: No fill post-op d/t nausea - states surgeon thinks band may be too tight  Recent physical activity:  None reported  Progress Towards Goal(s):  In progress.  Handouts given during visit include:  Stage 3B: High Protein & Non-Starchy Vegetables   Nutritional Diagnosis:  NI-2.1 Inadequate oral intake related to recent LAGB surgery as evidenced by patient report of  decreased PO intake d/t feeling that food gets stuck in throat and nausea.    Intervention:  Nutrition education/reinforcement.  Monitoring/Evaluation:  Dietary intake, exercise, lap band fills, and body weight. Follow up in 1 months for 3 month post-op visit.

## 2012-03-17 NOTE — Patient Instructions (Addendum)
Goals:  Follow Phase 3B: High Protein + Non-Starchy Vegetables once nausea is resolved  Eat 3-6 small meals/snacks, every 3-5 hrs  Increase lean protein foods to meet 60-80g goal  Increase fluid intake to 64oz +  Avoid drinking 15 minutes before, during and 30 minutes after eating  Aim for >30 min of physical activity daily  Make sure to take small bites and chew thoroughly; Sip drinks slowly as well.

## 2012-03-22 ENCOUNTER — Telehealth (INDEPENDENT_AMBULATORY_CARE_PROVIDER_SITE_OTHER): Payer: Self-pay | Admitting: General Surgery

## 2012-03-22 NOTE — Telephone Encounter (Signed)
Pt called to decline appt today with Dr. 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

## 2012-03-22 NOTE — Telephone Encounter (Signed)
Pt called for UGI results: all normal---lap band in place, no obstruction, no reflux.  She is still symptomatic with strong feeling of "something stuck in throat" and is now beginning to cough more.  She reports she has tried prevacid and calcium tabs.  Pt is eating only one small solid meal a day if she thinks she can get it down, but is focusing on fluids.  She wants to know what should be done at this point for relief of symptoms.

## 2012-03-23 ENCOUNTER — Telehealth (INDEPENDENT_AMBULATORY_CARE_PROVIDER_SITE_OTHER): Payer: Self-pay | Admitting: General Surgery

## 2012-03-23 NOTE — Telephone Encounter (Signed)
Pt called to ask what Dr. Daphine Deutscher suggests she do about the on-going feeling of "something stuck in the throat."  Dr. Daphine Deutscher would like for about 0.5 cc of fluid be removed from her lap band; scheduled pt into the [facility] Band [facility] tomorrow.  She understands.

## 2012-03-24 ENCOUNTER — Encounter (INDEPENDENT_AMBULATORY_CARE_PROVIDER_SITE_OTHER): Payer: Self-pay | Admitting: Physician Assistant

## 2012-03-24 ENCOUNTER — Ambulatory Visit (INDEPENDENT_AMBULATORY_CARE_PROVIDER_SITE_OTHER): Payer: 59 | Admitting: Physician Assistant

## 2012-03-24 DIAGNOSIS — Z4651 Encounter for fitting and adjustment of gastric lap band: Secondary | ICD-10-CM

## 2012-03-24 DIAGNOSIS — E65 Localized adiposity: Secondary | ICD-10-CM

## 2012-03-24 NOTE — Patient Instructions (Signed)
Return in one month. If you continue to have the sensation of something in your throat, call the office and speak with Marcelino Duster or Annabelle Harman to set up an appointment with Dr. Daphine Deutscher.

## 2012-03-24 NOTE — Progress Notes (Signed)
  HISTORY: Samantha Tyler is a 41 y.o.female who received an AP-Standard lap-band in May 2013 by Dr. Daphine Deutscher. She comes in with continued globus sensation despite a normal upper GI and no true solid food dysphagia. This has caused her significant anxiety to the point of asking about band removal. She has had no regurgitation at all.  VITAL SIGNS: There were no vitals filed for this visit.  PHYSICAL EXAM: Physical exam reveals a very well-appearing 41 y.o.female in no apparent distress Neurologic: Awake, alert, oriented Psych: Bright affect, conversant Respiratory: Breathing even and unlabored. No stridor or wheezing Abdomen: Soft, nontender, nondistended to palpation. Incisions well-healed. No incisional hernias. Port easily palpated. Extremities: Atraumatic, good range of motion.  ASSESMENT: 41 y.o.  female  s/p AP-Standard lap-band.   PLAN: The patient's port was accessed with a 20G Huber needle without difficulty. Clear fluid was aspirated and 1.5 mL was removed. There's at least 1.5 mL more left in the band, likely more. I've set her up to see me in one month but I asked her to call our nursing staff if she continues to have the globus sensation next week. She voiced understanding and agreement.

## 2012-04-14 ENCOUNTER — Encounter: Payer: Self-pay | Admitting: *Deleted

## 2012-04-14 ENCOUNTER — Encounter: Payer: 59 | Attending: Surgery | Admitting: *Deleted

## 2012-04-14 VITALS — Ht 69.0 in | Wt 202.0 lb

## 2012-04-14 DIAGNOSIS — Z713 Dietary counseling and surveillance: Secondary | ICD-10-CM | POA: Insufficient documentation

## 2012-04-14 DIAGNOSIS — Z01818 Encounter for other preprocedural examination: Secondary | ICD-10-CM | POA: Insufficient documentation

## 2012-04-14 DIAGNOSIS — E66813 Obesity, class 3: Secondary | ICD-10-CM

## 2012-04-14 NOTE — Patient Instructions (Signed)
Goals:  Follow Phase 3B: High Protein + Non-Starchy Vegetables once nausea is resolved  Eat 3-6 small meals/snacks, every 3-5 hrs  Increase lean protein foods to meet 60-80g goal  Increase fluid intake to 64oz +  Avoid drinking 15 minutes before, during and 30 minutes after eating  Aim for >30 min of physical activity daily  Make sure to take small bites and chew thoroughly; Sip drinks slowly as well.  Call [facility] for follow up appointment once you see Dr. Daphine Deutscher.

## 2012-04-14 NOTE — Progress Notes (Addendum)
Follow-up visit: 12 Weeks Post-Operative LAGB Surgery  Medical Nutrition Therapy:  Appt start time: 0830 end time:  0900.  Primary concerns today: Post-operative Bariatric Surgery Nutrition Management. Samantha Tyler returns today for f/u adamant she wants her band removed. States she still has feeling of food/fluid sticking in her throat after 1.5 mL removed from band on 03/24/12; "it feels like a pill is stuck in my throat all the time".  Her anxiety is worsening and she worries about losing too much weight too quickly. 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 States that the MD (Dr. Lin Givens) following her MS recommends band removal.  [name] has also met with Dr. Cyndia Skeeters again to r/o psychological aspect. No notes available on this visit.   Surgery date: 12/29/11 Surgery type: LAGB Start Weight at [facility]: 282.6 lbs   Weight today: 202.0 lb  Weight change: 10.5 lbs Total weight lost: 80.6 lbs BMI: 29.8 kg/m^2  Weight goal: 180 lbs % Weight goal met: 68%  TANITA  BODY COMP RESULTS  03/17/12 04/14/12   %Fat 45.5% 43.1%   Fat Mass (lbs) 96.5 87.0   Fat Free Mass (lbs) 116.0 115.0   Total Body Water (lbs) 85.0 84.0   Dietary intake: Reports drinking a (30g) protein shake for breakfast and eating small amounts of [place] and tuna for other meals.  Fluid intake: ~45-50 oz Estimated total protein intake: 50-70 g  Medications: See medication list. Supplementation: Had d/c'd previously thinking it would help nausea, but did not. Recently resumed taking regularly.  Using straws: No Drinking while eating: No Hair loss: No Carbonated beverages: No N/V/D/C: Nausea - becoming progressively worse since 02/12/12; Constipation reported; likely d/t decreased fluids Last Lap-Band fill: 1.5 mL taken out of band on 03/24/12  Recent physical activity:  None reported  Progress Towards Goal(s):  In progress.   Nutritional Diagnosis:  NI-2.1 Inadequate  oral intake related to recent LAGB surgery as evidenced by patient report of decreased PO intake d/t feeling that food gets stuck in throat and nausea.    Intervention:  Nutrition education/reinforcement.  Monitoring/Evaluation:  Dietary intake, exercise, lap band fills, and body weight. Follow up TBD after seeing Dr. Daphine Deutscher.

## 2012-04-15 ENCOUNTER — Encounter (INDEPENDENT_AMBULATORY_CARE_PROVIDER_SITE_OTHER): Payer: Self-pay | Admitting: Surgery

## 2012-04-15 ENCOUNTER — Ambulatory Visit (INDEPENDENT_AMBULATORY_CARE_PROVIDER_SITE_OTHER): Payer: 59 | Admitting: Surgery

## 2012-04-15 VITALS — BP 112/74 | HR 72 | Temp 98.4°F | Resp 16 | Ht 69.0 in | Wt 203.4 lb

## 2012-04-15 DIAGNOSIS — R1310 Dysphagia, unspecified: Secondary | ICD-10-CM | POA: Insufficient documentation

## 2012-04-15 NOTE — Patient Instructions (Signed)
Peppermint oil from whole foods Warm liquids instead of cold whenever possible Omega 3 fatty acids (Coromega) Upper endoscopy

## 2012-04-15 NOTE — Progress Notes (Signed)
Samantha Tyler 41 y.o.  Body mass index is 30.04 kg/(m^2).  Patient Active Problem List  Diagnosis  . HYPERLIPIDEMIA  . HYPERSOMNIA, PERSISTENT  . ALLERGIC RHINITIS  . MS (multiple sclerosis)  . Obesity, Class III, BMI 40-49.9 (morbid obesity)  . Lapband APS with hiatus hernia repair May 2013    No Known Allergies  Past Surgical History  Procedure Date  . Hernia repair 02/1995    inguinal  . Breath tek h pylori 10/23/2011    Procedure: BREATH TEK H PYLORI;  Surgeon: Dailon Sheeran B Diavion Labrador, MD;  Location: [facility];  Service: General;  Laterality: N/A;  . Laparoscopic gastric banding 12/29/2011    Procedure: LAPAROSCOPIC GASTRIC BANDING;  Surgeon: Niani Mourer B Magally Vahle, MD;  Location: [facility] ORS;  Service: General;  Laterality: N/A;   JOBE,DANIEL B., MD No diagnosis found.  Office visit:  Ms. Batton and her parents came in to discuss her symptoms since the end of June. She got along very well initially and when I saw her on May 30 she is not having any problems. In late June around the 21st she suddenly noticed the onset of the proximal lump in her along with nausea. This is m about 60 pound weight loss since then 24 since her last visit in June but more importantly with aggravation of her MS symptoms. She saw her MS physician who recommended that we remove her lapband. I think it these symptoms are more likely related to her weight loss in the lap band per se. I went ahead and removed the remaining 1.5 cc of fluid from her band have encouraged her again on cover minimal to see if they'll relaxer smooth muscle and cutdown spasm. She also has a cough and I think it's related to some of this dysphasia. Organ etc. for an endoscopy with Dr. Newman with amount showed her upper GI. I did show the upper GI to her folks and try to explain this.  My instinct tell me that this may be related to a flare of her MS. I can't say this related to the band per se but may be related to weight loss. We'll try the endoscopy  and see if we need to dilate anything to make sure her hiatal hernia repair is causing some degree of dysphasia and then if she still wants her band outcome we will remove it. Matt B. Neriyah Cercone, MD, FACS  [facility] [phone] beeper [phone]  04/15/2012 5:10 PM   

## 2012-04-21 ENCOUNTER — Encounter (INDEPENDENT_AMBULATORY_CARE_PROVIDER_SITE_OTHER): Payer: 59

## 2012-04-22 ENCOUNTER — Encounter (INDEPENDENT_AMBULATORY_CARE_PROVIDER_SITE_OTHER): Payer: 59 | Admitting: Surgery

## 2012-04-28 ENCOUNTER — Telehealth (INDEPENDENT_AMBULATORY_CARE_PROVIDER_SITE_OTHER): Payer: Self-pay | Admitting: General Surgery

## 2012-04-28 NOTE — Telephone Encounter (Signed)
Pt is calling in to report new Rt sided pain (dull) that radiated to back and up to shoulder area.  This pain occurs 1-2 x/ week.  She is concerned it may be from her gallbladder.  Pt denies nausea, vomiting or reflux at this time.  She cannot associate anything or food that precipitates the pain.  Please advise.

## 2012-04-28 NOTE — Telephone Encounter (Signed)
Called pt with Dr. Ermalene Searing response---to followup with EGD later this month.  She understands and will comply.

## 2012-04-28 NOTE — Telephone Encounter (Signed)
Please let patient know per Dr Daphine Deutscher she had a negative gallbladder ultrasound in January 2013. She is scheduled for an EGD on 05/12/12 and it would be best to proceed with this test to evaluate patient's symptoms.

## 2012-05-06 ENCOUNTER — Telehealth (INDEPENDENT_AMBULATORY_CARE_PROVIDER_SITE_OTHER): Payer: Self-pay

## 2012-05-06 NOTE — Telephone Encounter (Signed)
Pt called stating she is starting to have more frequent episodes of ruq discomfort that radiates to right upper back. No vomiting. No SOB. Pt states able to tolerate the limited lap band diet. Pt states she is scheduled to have and upper endoscopy with Dr Ezzard Standing in near future for theses symptoms.  Pt just wanted Dr. Daphine Deutscher and Dr Ezzard Standing to be aware. Pt advised I will send msg to MD and Nurse.

## 2012-05-09 ENCOUNTER — Other Ambulatory Visit: Payer: Self-pay

## 2012-05-09 ENCOUNTER — Emergency Department (HOSPITAL_COMMUNITY)
Admission: EM | Admit: 2012-05-09 | Discharge: 2012-05-09 | Disposition: A | Discharge status: Home / Self Care | Payer: 59 | Attending: Emergency Medicine | Admitting: Emergency Medicine

## 2012-05-09 ENCOUNTER — Emergency Department (HOSPITAL_COMMUNITY): Payer: 59

## 2012-05-09 ENCOUNTER — Encounter (HOSPITAL_COMMUNITY): Payer: Self-pay

## 2012-05-09 DIAGNOSIS — E785 Hyperlipidemia, unspecified: Secondary | ICD-10-CM | POA: Insufficient documentation

## 2012-05-09 DIAGNOSIS — K8020 Calculus of gallbladder without cholecystitis without obstruction: Secondary | ICD-10-CM

## 2012-05-09 DIAGNOSIS — R1011 Right upper quadrant pain: Secondary | ICD-10-CM | POA: Insufficient documentation

## 2012-05-09 DIAGNOSIS — K8050 Calculus of bile duct without cholangitis or cholecystitis without obstruction: Secondary | ICD-10-CM

## 2012-05-09 DIAGNOSIS — R110 Nausea: Secondary | ICD-10-CM | POA: Insufficient documentation

## 2012-05-09 LAB — COMPREHENSIVE METABOLIC PANEL
ALT: 28 U/L (ref 0–35)
Alkaline Phosphatase: 59 U/L (ref 39–117)
BUN: 15 mg/dL (ref 6–23)
CO2: 24 mEq/L (ref 19–32)
GFR calc Af Amer: >90 mL/min (ref >90)
GFR calc non Af Amer: >90 mL/min (ref >90)
Glucose, Bld: 83 mg/dL (ref 70–99)
Potassium: 3.5 mEq/L (ref 3.5–5.1)
Sodium: 137 mEq/L (ref 135–145)
Total Bilirubin: 0.6 mg/dL (ref 0.3–1.2)

## 2012-05-09 LAB — URINALYSIS, ROUTINE W REFLEX MICROSCOPIC
Bilirubin Urine: NEGATIVE
Hgb urine dipstick: NEGATIVE
Ketones, ur: 15 mg/dL — AB
Protein, ur: NEGATIVE mg/dL
Urobilinogen, UA: 0.2 mg/dL (ref 0.0–1.0)

## 2012-05-09 LAB — CBC WITH DIFFERENTIAL/PLATELET
Hemoglobin: 13.3 g/dL (ref 12.0–15.0)
Lymphocytes Relative: 8 % — ABNORMAL LOW (ref 12–46)
Lymphs Abs: 0.4 10*3/uL — ABNORMAL LOW (ref 0.7–4.0)
MCH: 30.4 pg (ref 26.0–34.0)
MCV: 90.2 fL (ref 78.0–100.0)
Monocytes Relative: 8 % (ref 3–12)
Neutrophils Relative %: 82 % — ABNORMAL HIGH (ref 43–77)
Platelets: 195 10*3/uL (ref 150–400)
RBC: 4.37 MIL/uL (ref 3.87–5.11)
WBC: 5.2 10*3/uL (ref 4.0–10.5)

## 2012-05-09 LAB — LIPASE, BLOOD: Lipase: 48 U/L (ref 11–59)

## 2012-05-09 MED ORDER — HYDROMORPHONE HCL PF 1 MG/ML IJ SOLN
1.0000 mg | Freq: Once | INTRAMUSCULAR | Status: AC
  Administered 2012-05-09: 1 mg via INTRAVENOUS
  Filled 2012-05-09: qty 1

## 2012-05-09 MED ORDER — ONDANSETRON HCL 4 MG/2ML IJ SOLN
4.0000 mg | Freq: Once | INTRAMUSCULAR | Status: AC
  Administered 2012-05-09: 4 mg via INTRAVENOUS
  Filled 2012-05-09: qty 2

## 2012-05-09 MED ORDER — SODIUM CHLORIDE 0.9 % IV BOLUS (SEPSIS)
1000.0000 mL | Freq: Once | INTRAVENOUS | Status: AC
  Administered 2012-05-09: 1000 mL via INTRAVENOUS

## 2012-05-09 NOTE — ED Notes (Signed)
Patient states that she has right upper quadrant pain that radiates into her back or right shoulder area. Patient states the pain has gotten progressively worse. Patient also has had Lap Band surgery. Patient also has nausea , but no vomiting.

## 2012-05-09 NOTE — ED Notes (Signed)
Pt back from US

## 2012-05-09 NOTE — ED Provider Notes (Signed)
Samantha Tyler 8:00 PM patient discussed in sign out with tea all of her PAC. Patient with history of lap band procedure presenting with right upper quadrant abdominal pains off and on for the past 2 weeks. At this time suspect biliary cause. Normal LFTs, bilirubin and lipase. No elevated WBC. Ultrasound pending.   Ultrasound demonstrates multiple gallstones. No signs for acute cholecystitis or obstructing stone. Patient reports feeling much better after medications. She continues to be comfortable with no significant tenderness. At this time patient stable for discharge home. She will call Dr. Daphine Deutscher with general surgery tomorrow for close followup.    Angus Seller, [place] 05/09/12 2248

## 2012-05-09 NOTE — ED Provider Notes (Signed)
History     CSN: [id number]  Arrival date & time 05/09/12  1436   First MD Initiated Contact with Patient 05/09/12 1836      Chief Complaint  Patient presents with  . Abdominal Pain    (Consider location/radiation/quality/duration/timing/severity/associated sxs/prior treatment) HPI Comments: Samantha Tyler 41 y.o. female   The chief complaint is: Patient presents with:   Abdominal Pain   The patient has medical history significant for:   Past Medical History:   Hyperlipidemia                                               Depression                                                   History of colon polyps                                      Multiple sclerosis                              12-23-11         Comment:dx.12'09-Recent tx. Tysabri(last IV tx.               09-21-11), only Ritalin now   Fatigue                                                      Hernia                                                       Colon polyps                                                 History of colon polyps                                      Contact lens/glasses fitting                                Patient presents with RUQ pain and right rib pain X 2 weeks. She rates the pain as 3/10 with some transmission to her right shoulder. Associated symptoms include nausea, worse with eating. Patient admits to abdominal surgery to include lap band in May of 2013, a hernia was repair was also done at that time. She also had a different hernia repair in July of 1996. Denies fever or chills. Denies vomiting or diarrhea. Denies CP or SOB.  LMP: August 26 th.       The history is provided by the patient.    Past Medical History  Diagnosis Date  . Hyperlipidemia   . Depression   . History of colon polyps   . Multiple sclerosis 12-23-11    dx.12'09-Recent tx. Tysabri(last IV tx. 09-21-11), only Ritalin now  . Fatigue   . Hernia   . Colon polyps   . History of colon polyps   . Contact  lens/glasses fitting     Past Surgical History  Procedure Date  . Hernia repair 02/1995    inguinal  . Breath tek h pylori 10/23/2011    Procedure: BREATH TEK H PYLORI;  Surgeon: Valarie Merino, MD;  Location: [facility];  Service: General;  Laterality: N/A;  . Laparoscopic gastric banding 12/29/2011    Procedure: LAPAROSCOPIC GASTRIC BANDING;  Surgeon: Valarie Merino, MD;  Location: [facility];  Service: General;  Laterality: N/A;    Family History  Problem Relation Age of Onset  . Hyperlipidemia Mother   . Hypertension Mother   . Hypothyroidism Mother   . Hyperlipidemia Father   . Hypertension Father     History  Substance Use Topics  . Smoking status: Never Smoker   . Smokeless tobacco: Never Used  . Alcohol Use: No     rare social    OB History    Grav Para Term Preterm Abortions TAB SAB Ect Mult Living                  Review of Systems  Constitutional: Negative for fever and chills.  Gastrointestinal: Positive for nausea and abdominal pain. Negative for vomiting and diarrhea.  Musculoskeletal: Positive for back pain.  All other systems reviewed and are negative.    Allergies  Review of patient's allergies indicates no known allergies.  Home Medications   Current Outpatient Rx  Name Route Sig Dispense Refill  . FINGOLIMOD HCL 0.5 MG PO CAPS Oral Take 1 capsule by mouth daily.       BP 120/69  Pulse 82  Temp 98.2 F (36.8 C) (Oral)  Resp 18  SpO2 100%  LMP 04/18/2012  Physical Exam  Nursing note and vitals reviewed. Constitutional: She appears well-developed and well-nourished. No distress.  HENT:  Head: Normocephalic and atraumatic.  Mouth/Throat: Oropharynx is clear and moist.  Eyes: Conjunctivae normal and EOM are normal. No scleral icterus.  Neck: Normal range of motion. Neck supple.  Cardiovascular: Normal rate, regular rhythm and normal heart sounds.   Pulmonary/Chest: Effort normal and breath sounds normal.  Abdominal: Soft. Bowel sounds  are normal. She exhibits no distension and no mass. There is tenderness. There is no rebound and no guarding.       Patient has tenderness in the RUQ, with a questionable murphy's sign.  Neurological: She is alert.  Skin: Skin is warm and dry.    ED Course  Procedures (including critical care time)  Labs Reviewed - No data to display No results found. Results for orders placed during the hospital encounter of 05/09/12  CBC WITH DIFFERENTIAL      Component Value Range   WBC 5.2  4.0 - 10.5 K/uL   RBC 4.37  3.87 - 5.11 MIL/uL   Hemoglobin 13.3  12.0 - 15.0 g/dL   HCT 19.1  47.8 - 29.5 %   MCV 90.2  78.0 - 100.0 fL   MCH 30.4  26.0 - 34.0 pg   MCHC 33.8  30.0 -  36.0 g/dL   RDW 16.1  09.6 - 04.5 %   Platelets 195  150 - 400 K/uL   Neutrophils Relative 82 (*) 43 - 77 %   Neutro Abs 4.3  1.7 - 7.7 K/uL   Lymphocytes Relative 8 (*) 12 - 46 %   Lymphs Abs 0.4 (*) 0.7 - 4.0 K/uL   Monocytes Relative 8  3 - 12 %   Monocytes Absolute 0.4  0.1 - 1.0 K/uL   Eosinophils Relative 2  0 - 5 %   Eosinophils Absolute 0.1  0.0 - 0.7 K/uL   Basophils Relative 0  0 - 1 %   Basophils Absolute 0.0  0.0 - 0.1 K/uL  URINALYSIS, ROUTINE W REFLEX MICROSCOPIC      Component Value Range   Color, Urine YELLOW  YELLOW   APPearance CLEAR  CLEAR   Specific Gravity, Urine 1.020  1.005 - 1.030   pH 5.5  5.0 - 8.0   Glucose, UA NEGATIVE  NEGATIVE mg/dL   Hgb urine dipstick NEGATIVE  NEGATIVE   Bilirubin Urine NEGATIVE  NEGATIVE   Ketones, ur 15 (*) NEGATIVE mg/dL   Protein, ur NEGATIVE  NEGATIVE mg/dL   Urobilinogen, UA 0.2  0.0 - 1.0 mg/dL   Nitrite NEGATIVE  NEGATIVE   Leukocytes, UA NEGATIVE  NEGATIVE    Date: 05/09/2012  Rate: 87  Rhythm: normal sinus rhythm  QRS Axis: normal  Intervals: normal  ST/T Wave abnormalities: normal  Conduction Disutrbances:none  Narrative Interpretation: No STEMI  Old EKG Reviewed: unchanged    1. RUQ pain   2. Nausea       MDM  Patient present s/p lap  band surgery in 12/2011 with RUQ pain and nausea. Patient given pain medication and antinausea medication in the ED with improvement. EKG: unremarkable CBC: unremarkable. UA: unremarkable. CMP: unremarkable Lipase: unremarkable. Abdominal US complete: pending. Most concerned about cholelithiasis or biliary colic. Patient care signed out to [facility], PA-C.        Pixie Casino, PA-C 05/09/12 2023

## 2012-05-09 NOTE — ED Notes (Signed)
Pt stated she had lap band surgery in May.  Pt has been having RUQ pain that radiates around to her back and up to right shoulder times 2 weeks.  Pt states the pain is 2/10 right now and 4/10 at it's worst.  Pt states she has been nauseated today but denies any vomiting.  Pt denies diarrhea and any possibility of being pregnant and LMP was Aug 26.  Pt states that she has endoscopy on Thursday due to having a "lump in her throat since her surgery".  Pt has hx of hernias.

## 2012-05-09 NOTE — ED Notes (Signed)
Patient transported to US 

## 2012-05-10 NOTE — ED Provider Notes (Signed)
Medical screening examination/treatment/procedure(s) were performed by non-physician practitioner and as supervising physician I was immediately available for consultation/collaboration.  Jones Skene, M.D.     Jones Skene, MD 05/10/12 0236

## 2012-05-10 NOTE — ED Provider Notes (Signed)
Medical screening examination/treatment/procedure(s) were conducted as a shared visit with non-physician practitioner(s) and myself.  I personally evaluated the patient during the encounter Jones Skene, M.D.  Samantha Tyler is a 41 y.o. female status post lap band who has lost about 60 pounds since having the band placed. Patient's presentation is consistent with biliary colic versus cholecystitis tonight. Agree with PA plan to workup the patient with labs LFTs and ultrasound of right upper quadrant. Patient's currently comfortable after pain medicine and antiemetics.  VITAL SIGNS:   Filed Vitals:   05/09/12 1638  BP: 120/69  Pulse: 82  Temp: 98.2 F (36.8 C)  Resp: 18   CONSTITUTIONAL: Awake, oriented, appears non-toxic HENT: Atraumatic, normocephalic, oral mucosa pink and moist, airway patent. Nares patent without drainage. External ears normal. EYES: Conjunctiva clear, EOMI, PERRLA NECK: Trachea midline, non-tender, supple CARDIOVASCULAR: Normal heart rate, Normal rhythm, No murmurs, rubs, gallops PULMONARY/CHEST: Clear to auscultation, no rhonchi, wheezes, or rales. Symmetrical breath sounds. Non-tender. ABDOMINAL: Non-distended, soft, mild tenderness to palpation in the right upper quadrant. LAP-BAND port is easily palpated in the center of the abdomen.  BS normal. NEUROLOGIC: Non-focal, moving all four extremities, no gross sensory or motor deficits. EXTREMITIES: No clubbing, cyanosis, or edema SKIN: Warm, Dry, No erythema, No rash  MDM: Patient had good relief pain symptoms while in the emergency department, ultrasound did not show acute cholecystitis likewise labs did not support that diagnosis however she does have gallstones in her gallbladder which appear to become symptomatic. She will followup with her general surgeon.   Jones Skene, MD 05/10/12 [id number]

## 2012-05-12 ENCOUNTER — Encounter (HOSPITAL_COMMUNITY): Payer: Self-pay | Admitting: *Deleted

## 2012-05-12 ENCOUNTER — Encounter (HOSPITAL_COMMUNITY)
Admission: RE | Disposition: A | Discharge status: Home / Self Care | Payer: Self-pay | Source: Ambulatory Visit | Attending: Surgery

## 2012-05-12 ENCOUNTER — Ambulatory Visit (HOSPITAL_COMMUNITY)
Admission: RE | Admit: 2012-05-12 | Discharge: 2012-05-12 | Disposition: A | Discharge status: Home / Self Care | Payer: 59 | Source: Ambulatory Visit | Attending: Surgery | Admitting: Surgery

## 2012-05-12 DIAGNOSIS — K8020 Calculus of gallbladder without cholecystitis without obstruction: Secondary | ICD-10-CM | POA: Insufficient documentation

## 2012-05-12 DIAGNOSIS — R198 Other specified symptoms and signs involving the digestive system and abdomen: Secondary | ICD-10-CM

## 2012-05-12 DIAGNOSIS — G35 Multiple sclerosis: Secondary | ICD-10-CM | POA: Insufficient documentation

## 2012-05-12 DIAGNOSIS — Z9884 Bariatric surgery status: Secondary | ICD-10-CM | POA: Insufficient documentation

## 2012-05-12 DIAGNOSIS — K228 Other specified diseases of esophagus: Secondary | ICD-10-CM | POA: Insufficient documentation

## 2012-05-12 DIAGNOSIS — R1310 Dysphagia, unspecified: Secondary | ICD-10-CM

## 2012-05-12 DIAGNOSIS — E785 Hyperlipidemia, unspecified: Secondary | ICD-10-CM | POA: Insufficient documentation

## 2012-05-12 DIAGNOSIS — K2289 Other specified disease of esophagus: Secondary | ICD-10-CM | POA: Insufficient documentation

## 2012-05-12 HISTORY — PX: ESOPHAGOGASTRODUODENOSCOPY: SHX5428

## 2012-05-12 HISTORY — DX: Calculus of gallbladder and bile duct with cholecystitis, unspecified, without obstruction: K80.60

## 2012-05-12 SURGERY — EGD (ESOPHAGOGASTRODUODENOSCOPY)
Anesthesia: Moderate Sedation

## 2012-05-12 MED ORDER — FENTANYL CITRATE 0.05 MG/ML IJ SOLN
INTRAMUSCULAR | Status: AC
  Filled 2012-05-12: qty 2

## 2012-05-12 MED ORDER — FENTANYL CITRATE 0.05 MG/ML IJ SOLN
INTRAMUSCULAR | Status: DC | PRN
  Administered 2012-05-12 (×3): 25 ug via INTRAVENOUS

## 2012-05-12 MED ORDER — MIDAZOLAM HCL 10 MG/2ML IJ SOLN
INTRAMUSCULAR | Status: AC
  Filled 2012-05-12: qty 2

## 2012-05-12 MED ORDER — BUTAMBEN-TETRACAINE-BENZOCAINE 2-2-14 % EX AERO
INHALATION_SPRAY | CUTANEOUS | Status: DC | PRN
  Administered 2012-05-12: 3 via TOPICAL

## 2012-05-12 MED ORDER — DIPHENHYDRAMINE HCL 50 MG/ML IJ SOLN
INTRAMUSCULAR | Status: AC
  Filled 2012-05-12: qty 1

## 2012-05-12 MED ORDER — SODIUM CHLORIDE 0.9 % IV SOLN
INTRAVENOUS | Status: DC
  Administered 2012-05-12: 12:00:00 via INTRAVENOUS

## 2012-05-12 MED ORDER — MIDAZOLAM HCL 10 MG/2ML IJ SOLN
INTRAMUSCULAR | Status: DC | PRN
  Administered 2012-05-12 (×2): 2 mg via INTRAVENOUS
  Administered 2012-05-12 (×2): 1 mg via INTRAVENOUS

## 2012-05-12 NOTE — Discharge Instructions (Signed)
Endoscopy °Care After °Please read the instructions outlined below and refer to this sheet in the next few weeks. These discharge instructions provide you with general information on caring for yourself after you leave the hospital. Your doctor may also give you specific instructions. While your treatment has been planned according to the most current medical practices available, unavoidable complications occasionally occur. If you have any problems or questions after discharge, please call your doctor. °HOME CARE INSTRUCTIONS °Activity °· You may resume your regular activity but move at a slower pace for the next 24 hours.  °· Take frequent rest periods for the next 24 hours.  °· Walking will help expel (get rid of) the air and reduce the bloated feeling in your abdomen.  °· No driving for 24 hours (because of the anesthesia (medicine) used during the test).  °· You may shower.  °· Do not sign any important legal documents or operate any machinery for 24 hours (because of the anesthesia used during the test).  °Nutrition °· Drink plenty of fluids.  °· You may resume your normal diet.  °· Begin with a light meal and progress to your normal diet.  °· Avoid alcoholic beverages for 24 hours or as instructed by your caregiver.  °Medications °You may resume your normal medications unless your caregiver tells you otherwise. °What you can expect today °· You may experience abdominal discomfort such as a feeling of fullness or "gas" pains.  °· You may experience a sore throat for 2 to 3 days. This is normal. Gargling with salt water may help this.  °Follow-up °Your doctor will discuss the results of your test with you. °SEEK IMMEDIATE MEDICAL CARE IF: °· You have excessive nausea (feeling sick to your stomach) and/or vomiting.  °· You have severe abdominal pain and distention (swelling).  °· You have trouble swallowing.  °· You have a temperature over 100° F (37.8° C).  °· You have rectal bleeding or vomiting of blood.    °Document Released: 03/24/2004 Document Revised: 04/22/2011 Document Reviewed: 10/05/2007 °ExitCare® Patient Information ©2012 [employer]. °

## 2012-05-12 NOTE — H&P (View-Only) (Signed)
Samantha Tyler 42 y.o.  Body mass index is 30.04 kg/(m^2).  Patient Active Problem List  Diagnosis  . HYPERLIPIDEMIA  . HYPERSOMNIA, PERSISTENT  . ALLERGIC RHINITIS  . MS (multiple sclerosis)  . Obesity, Class III, BMI 40-49.9 (morbid obesity)  . Lapband APS with hiatus hernia repair May 2013    No Known Allergies  Past Surgical History  Procedure Date  . Hernia repair 02/1995    inguinal  . Breath tek h pylori 10/23/2011    Procedure: BREATH TEK H PYLORI;  Surgeon: Valarie Merino, MD;  Location: [facility];  Service: General;  Laterality: N/A;  . Laparoscopic gastric banding 12/29/2011    Procedure: LAPAROSCOPIC GASTRIC BANDING;  Surgeon: Valarie Merino, MD;  Location: [facility];  Service: General;  Laterality: N/A;   [name], MD No diagnosis found.  Office visit:  [name] and her parents came in to discuss her symptoms since the end of June. She got along very well initially and when I saw her on May 30 she is not having any problems. In late June around the 21st she suddenly noticed the onset of the proximal lump in her along with nausea. This is m about 60 pound weight loss since then 24 since her last visit in June but more importantly with aggravation of her MS symptoms. She saw her MS physician who recommended that we remove her lapband. I think it these symptoms are more likely related to her weight loss in the lap band per se. I went ahead and removed the remaining 1.5 cc of fluid from her band have encouraged her again on cover minimal to see if they'll relaxer smooth muscle and cutdown spasm. She also has a cough and I think it's related to some of this dysphasia. Organ etc. for an endoscopy with Dr. Ezzard Standing with amount showed her upper GI. I did show the upper GI to her folks and try to explain this.  My instinct tell me that this may be related to a flare of her MS. I can't say this related to the band per se but may be related to weight loss. We'll try the endoscopy  and see if we need to dilate anything to make sure her hiatal hernia repair is causing some degree of dysphasia and then if she still wants her band outcome we will remove it. Matt B. Daphine Deutscher, MD, [facility] [phone] beeper [phone]  04/15/2012 5:10 PM

## 2012-05-12 NOTE — Interval H&P Note (Signed)
History and Physical Interval Note:  05/12/2012 12:21 PM  Samantha Tyler  has presented today for surgery, with the diagnosis of dyplasia of lapband  The various methods of treatment have been discussed with the patient and family.  Fred, dad, with patient.     After consideration of risks, benefits and other options for treatment, the patient has consented to  Procedure(s) (LRB) with comments: ESOPHAGOGASTRODUODENOSCOPY (EGD) (N/A) as a surgical intervention .    The patient's history has been reviewed, patient examined, no change in status, stable for surgery.  I have reviewed the patient's chart and labs.  Questions were answered to the patient's satisfaction.     Korene Dula H

## 2012-05-12 NOTE — Progress Notes (Signed)
Pt noticed tiny, flat red rash on torso extending all the way around.  Dr. Ezzard Standing notified.  Will watch for few more minutes before discharge.  Pt asymptomatic.  VSS.  @ 1340  1350 Rash is better.  Less red and still asymptomatic.  D/C home

## 2012-05-12 NOTE — Op Note (Signed)
PREOP DIAGNOSIS:  S/P lap band, feels lump in upper esophagus  POSTOP DIAGNOSIS:   Normal esophagus, stomach with lap band imprint, duodenum  PROCEDURE:  Esophagogastroduedonoscopy  SURGEON:   Ovidio Kin, M.D.  ANESTHESIA:   Fentanyl  75 mcg   Versed 6 mg  INDICATIONS FOR PROCEDURE:  Samantha Tyler is a 41 y.o. (DOB: [birth year])  white female whose primary care physician is JOBE,DANIEL B., MD and comes for upper endoscopy to evaluate S/P lap band placed 12/29/2011.   She was doing well when she starting feeling a lump in upper esophagus on 02/12/2012.  She has a surprisingly specific memory of when this all started.  She is considering taking the lap band out.  She went to the ER 05/09/2012 and was found to have gall stones.   She is with her father in the [facility] endo unit.  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   The indications and risks of the endoscopy were explained to the patient.  The risks include, but are not limited to, perforation, bleeding, or injury to the bowel.  PROCEDURE:  The patient was monitored with a pulse oximetry, BP cuff, and EKG.  The patient has nasal O2 flowing during the procedure.   The back of the throat was anesthestized with Ceticaine.  A flexible Pentax endoscope was passed down the throat without difficulty.  Findings include:   Esophagus:   Normal.  I saw no mass or abnormality.  Her vocal cords were normal.  There is nothing to explain her symptoms.   GE junction at:  37 cm.  Lap Band orifice is at 40 cm.   Stomach: Normal. Scope retroflexed and there is a normal imprint of the lap band in the proximal stomach.   Duodenum:   Normal.  PLAN:  Has follow up with Dr. Daphine Deutscher, to discuss gall stones (these are new) and finding of upper endo (which was negative).

## 2012-05-13 ENCOUNTER — Encounter (HOSPITAL_COMMUNITY): Payer: Self-pay

## 2012-05-13 ENCOUNTER — Encounter (HOSPITAL_COMMUNITY): Payer: Self-pay | Admitting: Surgery

## 2012-05-13 ENCOUNTER — Ambulatory Visit (INDEPENDENT_AMBULATORY_CARE_PROVIDER_SITE_OTHER): Payer: 59 | Admitting: Surgery

## 2012-05-13 VITALS — BP 100/58 | HR 76 | Temp 97.2°F | Resp 16 | Ht 69.0 in | Wt 193.0 lb

## 2012-05-13 DIAGNOSIS — K8020 Calculus of gallbladder without cholecystitis without obstruction: Secondary | ICD-10-CM | POA: Insufficient documentation

## 2012-05-13 DIAGNOSIS — K819 Cholecystitis, unspecified: Secondary | ICD-10-CM

## 2012-05-13 NOTE — Patient Instructions (Addendum)
Thanks for your patience.  If you need further assistance after leaving the office, please call our office and speak with a [facility] nurse.  [phone].  If you want to leave a message for Dr. Neeley Sedivy, please call his office phone at [phone]. 

## 2012-05-13 NOTE — Progress Notes (Signed)
Chief Complaint:  Symptomatic gallstones and lapband dysfunction  History of Present Illness:  Samantha Tyler is an 41 y.o. female who recently spent the evening being worked up for RUQ pain and was found to have gallstones.  We had some discussions on her last visit and she wanted to have her band removed because the weight loss she experienced had caused her to have proximal dysphasia and also what she felt was exacerbation of her multiple sclerosis symptoms. She has thought about this and has decided she wants to have her band removed at the same time as her gallbladder removal.  Informed consent was obtained regarding laparoscopic cholecystectomy including complications not limited to, bile duct injury and bile leaks. She is aware of the complications of laparoscopy having had her lap band done this past May.  We'll proceed with scheduling laparoscopic cholecystectomy with concomitant removal of her lap band.  Upper endoscopy yesterday by Dr. Ezzard Standing was reviewed and was negative for erosion or obstruction.   Past Medical History  Diagnosis Date  . Hyperlipidemia   . Depression   . History of colon polyps   . Multiple sclerosis 12-23-11    dx.12'09-Recent tx. Tysabri(last IV tx. 09-21-11), only Ritalin now  . Fatigue   . Hernia   . Colon polyps   . History of colon polyps   . Contact lens/glasses fitting   . Chronic kidney disease     hx of kidney stones  . Gallbladder & bile duct stone, nonacute cholecystitis     Past Surgical History  Procedure Date  . Hernia repair 02/1995    inguinal  . Breath tek h pylori 10/23/2011    Procedure: BREATH TEK H PYLORI;  Surgeon: Valarie Merino, MD;  Location: [facility];  Service: General;  Laterality: N/A;  . Laparoscopic gastric banding 12/29/2011    Procedure: LAPAROSCOPIC GASTRIC BANDING;  Surgeon: Valarie Merino, MD;  Location: [facility];  Service: General;  Laterality: N/A;  . Colonoscopy   . Esophagogastroduodenoscopy 05/12/2012    Procedure:  ESOPHAGOGASTRODUODENOSCOPY (EGD);  Surgeon: Kandis Cocking, MD;  Location: [facility];  Service: General;  Laterality: N/A;    Current Outpatient Prescriptions  Medication Sig Dispense Refill  . ALPRAZolam (XANAX) 0.5 MG tablet Take 0.5 mg by mouth 2 (two) times daily as needed.      . Fingolimod HCl (GILENYA) 0.5 MG CAPS Take 1 capsule by mouth daily.        No current facility-administered medications for this visit.   Facility-Administered Medications Ordered in Other Visits  Medication Dose Route Frequency Provider Last Rate Last Dose  . DISCONTD: 0.9 %  sodium chloride infusion   Intravenous Continuous Kandis Cocking, MD 20 mL/hr at 05/12/12 1200     Tape Family History  Problem Relation Age of Onset  . Hyperlipidemia Mother   . Hypertension Mother   . Hypothyroidism Mother   . Hyperlipidemia Father   . Hypertension Father    Social History:   reports that she has never smoked. She has never used smokeless tobacco. She reports that she does not drink alcohol or use illicit drugs.   REVIEW OF SYSTEMS - PERTINENT POSITIVES ONLY: noncontributory  Physical Exam:   Blood pressure 100/58, pulse 76, temperature 97.2 F (36.2 C), temperature source Temporal, resp. rate 16, height 5\' 9"  (1.753 m), weight 193 lb (87.544 kg), last menstrual period 04/18/2012. Body mass index is 28.50 kg/(m^2).  Gen:  WDWN WF NAD  Neurological: Alert and oriented to  person, place, and time. Motor and sensory function is grossly intact  Head: Normocephalic and atraumatic.  Eyes: Conjunctivae are normal. Pupils are equal, round, and reactive to light. No scleral icterus.  Neck: Normal range of motion. Neck supple. No tracheal deviation or thyromegaly present.  Cardiovascular:  SR without murmurs or gallops.  No carotid bruits Respiratory: Effort normal.  No respiratory distress. No chest wall tenderness. Breath sounds normal.  No wheezes, rales or rhonchi.  Abdomen:  nontender at present but having  intermittent right sided pain GU: Musculoskeletal: Normal range of motion. Extremities are nontender. No cyanosis, edema or clubbing noted Lymphadenopathy: No cervical, preauricular, postauricular or axillary adenopathy is present Skin: Skin is warm and dry. No rash noted. No diaphoresis. No erythema. No pallor. Pscyh: Normal mood and affect. Behavior is normal. Judgment and thought content normal.   LABORATORY RESULTS: No results found for this or any previous visit (from the past 48 hour(s)).  RADIOLOGY RESULTS: No results found.  Problem List: Patient Active Problem List  Diagnosis  . HYPERLIPIDEMIA  . HYPERSOMNIA, PERSISTENT  . ALLERGIC RHINITIS  . MS (multiple sclerosis)  . Obesity, Class III, BMI 40-49.9 (morbid obesity)  . Lapband APS with hiatus hernia repair May 2013  . Dysphagia  . Gallstones    Assessment & Plan: History of cholecystitis and concern regarding her lapband and her dysphagia which may have nothing to do with the lapband since it occurred six weeks after the surgery.  The concern is that it is related to her MS exacerbation.    Lap chole and removal of lapband.      Matt B. Daphine Deutscher, MD, [facility] [phone] beeper [phone]  05/13/2012 1:31 PM

## 2012-05-24 ENCOUNTER — Ambulatory Visit: Admit: 2012-05-24 | Payer: Self-pay | Admitting: Surgery

## 2012-05-24 SURGERY — LAPAROSCOPIC CHOLECYSTECTOMY
Anesthesia: General

## 2012-06-02 ENCOUNTER — Encounter (HOSPITAL_COMMUNITY): Payer: Self-pay | Admitting: Pharmacy Technician

## 2012-06-08 ENCOUNTER — Encounter (INDEPENDENT_AMBULATORY_CARE_PROVIDER_SITE_OTHER): Payer: 59 | Admitting: Surgery

## 2012-06-08 NOTE — Patient Instructions (Signed)
22 Samantha Tyler  06/08/2012   Your procedure is scheduled on: 06/14/12 1215pm-345pm  Report to [facility] at 0945 AM.  Call this number if you have problems the morning of surgery: [phone]   Remember:   Do not eat food:After Midnight.  May have clear liquids:until Midnight .    Take these medicines the morning of surgery with A SIP OF WATER:    Do not wear jewelry, make-up or nail polish.  Do not wear lotions, powders, or perfumes. You may wear deodorant.  Do not shave 48 hours prior to surgery.   Do not bring valuables to the hospital.  Contacts, dentures or bridgework may not be worn into surgery.  Leave suitcase in the car. After surgery it may be brought to your room.  For patients admitted to the hospital, checkout time is 11:00 AM the day of discharge.                SEE CHG INSTRUCTION SHEET    Please read over the following fact sheets that you were given: MRSA Information, coughing and deep breathing exercises, leg exercises

## 2012-06-09 ENCOUNTER — Encounter (HOSPITAL_COMMUNITY): Payer: Self-pay

## 2012-06-09 ENCOUNTER — Encounter (HOSPITAL_COMMUNITY)
Admission: RE | Admit: 2012-06-09 | Discharge: 2012-06-09 | Disposition: A | Discharge status: Home / Self Care | Payer: 59 | Source: Ambulatory Visit | Attending: Surgery | Admitting: Surgery

## 2012-06-09 HISTORY — DX: Myoneural disorder, unspecified: G70.9

## 2012-06-09 HISTORY — DX: Peripheral vascular disease, unspecified: I73.9

## 2012-06-09 LAB — BASIC METABOLIC PANEL
GFR calc non Af Amer: >90 mL/min (ref >90)
Glucose, Bld: 77 mg/dL (ref 70–99)
Potassium: 3.8 mEq/L (ref 3.5–5.1)
Sodium: 140 mEq/L (ref 135–145)

## 2012-06-09 LAB — CBC
HCT: 42.7 % (ref 36.0–46.0)
MCV: 92.2 fL (ref 78.0–100.0)
Platelets: 185 10*3/uL (ref 150–400)
RBC: 4.63 MIL/uL (ref 3.87–5.11)
WBC: 7.1 10*3/uL (ref 4.0–10.5)

## 2012-06-09 LAB — HCG, SERUM, QUALITATIVE: Preg, Serum: NEGATIVE

## 2012-06-09 NOTE — Progress Notes (Signed)
Patient very anxious at preop appointment.  Patient complained of " pain in right groin, Could that be a blood clot? Do I need to go to ER?  " Asked patient how long pain had been present and she stated approximately two weeks off and on.  I told patient I did not know what her diagnosis was and that she could go to the ER, PCP or let Dr Luretha Murphy be aware.  Patient voiced understanding.  Patient admitted she was very anxious over surgery.  Patient also concerned about having period at time of surgery and explained to her that she would be given disposable panties with pad to wear.  Patient voiced understanding.

## 2012-06-11 ENCOUNTER — Emergency Department (HOSPITAL_BASED_OUTPATIENT_CLINIC_OR_DEPARTMENT_OTHER)
Admission: EM | Admit: 2012-06-11 | Discharge: 2012-06-11 | Disposition: A | Discharge status: Home / Self Care | Payer: 59 | Attending: Emergency Medicine | Admitting: Emergency Medicine

## 2012-06-11 ENCOUNTER — Encounter (HOSPITAL_BASED_OUTPATIENT_CLINIC_OR_DEPARTMENT_OTHER): Payer: Self-pay | Admitting: *Deleted

## 2012-06-11 DIAGNOSIS — R109 Unspecified abdominal pain: Secondary | ICD-10-CM | POA: Insufficient documentation

## 2012-06-11 DIAGNOSIS — F3289 Other specified depressive episodes: Secondary | ICD-10-CM | POA: Insufficient documentation

## 2012-06-11 DIAGNOSIS — R1030 Lower abdominal pain, unspecified: Secondary | ICD-10-CM

## 2012-06-11 DIAGNOSIS — F329 Major depressive disorder, single episode, unspecified: Secondary | ICD-10-CM | POA: Insufficient documentation

## 2012-06-11 DIAGNOSIS — Z79899 Other long term (current) drug therapy: Secondary | ICD-10-CM | POA: Insufficient documentation

## 2012-06-11 LAB — URINALYSIS, ROUTINE W REFLEX MICROSCOPIC
Glucose, UA: NEGATIVE mg/dL
Leukocytes, UA: NEGATIVE
Nitrite: NEGATIVE
Protein, ur: NEGATIVE mg/dL
Urobilinogen, UA: 0.2 mg/dL (ref 0.0–1.0)

## 2012-06-11 LAB — URINE MICROSCOPIC-ADD ON

## 2012-06-11 LAB — PREGNANCY, URINE: Preg Test, Ur: NEGATIVE

## 2012-06-11 NOTE — ED Notes (Signed)
D/c home with family

## 2012-06-11 NOTE — ED Provider Notes (Signed)
History   This chart was scribed for Samantha Shi, MD by Albertha Ghee Rifaie. This patient was seen in room MH12/MH12 and the patient's care was started at 9:00 PM.   CSN: [id number]  Arrival date & time 06/11/12  2000   First MD Initiated Contact with Patient 06/11/12 2102      Chief Complaint  Patient presents with  . Groin Pain     Patient is a 41 y.o. female presenting with groin pain. The history is provided by the patient. No language interpreter was used.  Groin Pain   Samantha Tyler is a 41 y.o. female who presents to the Emergency Department complaining of two weeks of intermittent right groin pain that would last (3-30) min. She also c/o lateral aspect of right leg tenderness. Pt states that she had labs done at [facility] for pre-op for gallbladder. She is concerned her symptoms are related to a blood clot. Pt denies any associated symptoms. Pt denies smoking and alcohol use.    Past Medical History  Diagnosis Date  . Hyperlipidemia   . Depression   . History of colon polyps   . Multiple sclerosis 12-23-11    dx.12'09-Recent tx. Tysabri(last IV tx. 09-21-11), only Ritalin now  . Fatigue   . Hernia   . Colon polyps   . History of colon polyps   . Contact lens/glasses fitting   . Chronic kidney disease     hx of kidney stones  . Gallbladder & bile duct stone, nonacute cholecystitis   . Peripheral vascular disease     varicose veins   . Neuromuscular disorder     MS     Past Surgical History  Procedure Date  . Hernia repair 02/1995    inguinal  . Breath tek h pylori 10/23/2011    Procedure: BREATH TEK H PYLORI;  Surgeon: Valarie Merino, MD;  Location: [facility];  Service: General;  Laterality: N/A;  . Laparoscopic gastric banding 12/29/2011    Procedure: LAPAROSCOPIC GASTRIC BANDING;  Surgeon: Valarie Merino, MD;  Location: [facility];  Service: General;  Laterality: N/A;  . Colonoscopy   . Esophagogastroduodenoscopy 05/12/2012    Procedure:  ESOPHAGOGASTRODUODENOSCOPY (EGD);  Surgeon: Kandis Cocking, MD;  Location: [facility];  Service: General;  Laterality: N/A;    Family History  Problem Relation Age of Onset  . Hyperlipidemia Mother   . Hypertension Mother   . Hypothyroidism Mother   . Hyperlipidemia Father   . Hypertension Father     History  Substance Use Topics  . Smoking status: Never Smoker   . Smokeless tobacco: Never Used  . Alcohol Use: No     rare social    No OB history provided.   Review of Systems  All other systems reviewed and are negative.    Allergies  Tape  Home Medications   Current Outpatient Rx  Name Route Sig Dispense Refill  . ALPRAZOLAM 0.5 MG PO TABS Oral Take 0.5 mg by mouth as needed. For anxiety patient only takes at bedtime as needed    . CALCIUM CARBONATE 1250 MG PO TABS Oral Take 1 tablet by mouth 3 (three) times daily.     . ADULT MULTIVITAMIN W/MINERALS CH Oral Take 1 tablet by mouth daily.      BP 113/61  Pulse 80  Temp 98.5 F (36.9 C) (Oral)  Resp 15  Ht 5\' 9"  (1.753 m)  Wt 181 lb (82.101 kg)  BMI 26.73 kg/m2  SpO2 100%  LMP 06/10/2012  Physical Exam  Nursing note and vitals reviewed. Constitutional: She is oriented to person, place, and time. She appears well-developed and well-nourished. No distress.  HENT:  Head: Normocephalic and atraumatic.  Eyes: Pupils are equal, round, and reactive to light.  Neck: Normal range of motion.  Cardiovascular: Normal rate and intact distal pulses.   Pulmonary/Chest: No respiratory distress.  Abdominal: Normal appearance. She exhibits no distension.  Musculoskeletal: Normal range of motion. She exhibits no edema and no tenderness.       Right upper leg: She exhibits no tenderness, no swelling and no edema.  Neurological: She is alert and oriented to person, place, and time. No cranial nerve deficit.  Skin: Skin is warm and dry. No rash noted.  Psychiatric: She has a normal mood and affect. Her behavior is normal.     ED Course  Procedures (including critical care time)  Labs Reviewed  URINALYSIS, ROUTINE W REFLEX MICROSCOPIC - Abnormal; Notable for the following:    Hgb urine dipstick TRACE (*)     All other components within normal limits  PREGNANCY, URINE  URINE MICROSCOPIC-ADD ON   No results found.  DIAGNOSTIC STUDIES: Oxygen Saturation is 100% on room air, normal by my interpretation.    COORDINATION OF CARE: 9:05 PM Discussed treatment plan with pt at bedside and pt agreed to plan.  1. Groin pain       MDM  I personally performed the services described in this documentation, which was scribed in my presence. The recorded information has been reviewed and considered.  . I discussed the possibility of doing a D-dimer test.  From a clinical standpoint I do not think she has a deep venous thrombosis.  The fact that the pain just comes and last a few minutes and goes away.  Along with the fact that she has no significant swelling of the leg did not lean towards DVT.  I did discuss with her that the clot was possible and then a d-dimer might help [place] determine whether one was present but she did not want to do the D-dimer test.  I guided her to return she has any further concerns.  Samantha Shi, MD 06/11/12 2120

## 2012-06-11 NOTE — ED Notes (Signed)
Right groin pain x 2 weeks. Tenderness on lateral aspect of right leg. Had labs done at [facility] for pre-op for gall bladder. Concerned this may be a clot.

## 2012-06-14 ENCOUNTER — Encounter (HOSPITAL_COMMUNITY): Payer: Self-pay | Admitting: Anesthesiology

## 2012-06-14 ENCOUNTER — Encounter (HOSPITAL_COMMUNITY): Payer: Self-pay | Admitting: *Deleted

## 2012-06-14 ENCOUNTER — Ambulatory Visit (HOSPITAL_COMMUNITY): Payer: 59

## 2012-06-14 ENCOUNTER — Observation Stay (HOSPITAL_COMMUNITY)
Admission: RE | Admit: 2012-06-14 | Discharge: 2012-06-15 | Disposition: A | Discharge status: Home / Self Care | Payer: 59 | Source: Ambulatory Visit | Attending: Surgery | Admitting: Surgery

## 2012-06-14 ENCOUNTER — Encounter (HOSPITAL_COMMUNITY): Payer: Self-pay | Admitting: Surgery

## 2012-06-14 ENCOUNTER — Ambulatory Visit (HOSPITAL_COMMUNITY): Payer: 59 | Admitting: Anesthesiology

## 2012-06-14 ENCOUNTER — Encounter (HOSPITAL_COMMUNITY)
Admission: RE | Disposition: A | Discharge status: Home / Self Care | Payer: Self-pay | Source: Ambulatory Visit | Attending: Surgery

## 2012-06-14 DIAGNOSIS — K8010 Calculus of gallbladder with chronic cholecystitis without obstruction: Principal | ICD-10-CM | POA: Insufficient documentation

## 2012-06-14 DIAGNOSIS — R1310 Dysphagia, unspecified: Secondary | ICD-10-CM | POA: Insufficient documentation

## 2012-06-14 DIAGNOSIS — E785 Hyperlipidemia, unspecified: Secondary | ICD-10-CM | POA: Insufficient documentation

## 2012-06-14 DIAGNOSIS — G35 Multiple sclerosis: Secondary | ICD-10-CM | POA: Insufficient documentation

## 2012-06-14 DIAGNOSIS — K9509 Other complications of gastric band procedure: Secondary | ICD-10-CM | POA: Insufficient documentation

## 2012-06-14 DIAGNOSIS — Y832 Surgical operation with anastomosis, bypass or graft as the cause of abnormal reaction of the patient, or of later complication, without mention of misadventure at the time of the procedure: Secondary | ICD-10-CM | POA: Insufficient documentation

## 2012-06-14 DIAGNOSIS — Z01812 Encounter for preprocedural laboratory examination: Secondary | ICD-10-CM | POA: Insufficient documentation

## 2012-06-14 DIAGNOSIS — Z79899 Other long term (current) drug therapy: Secondary | ICD-10-CM | POA: Insufficient documentation

## 2012-06-14 DIAGNOSIS — Z9884 Bariatric surgery status: Secondary | ICD-10-CM

## 2012-06-14 HISTORY — PX: CHOLECYSTECTOMY: SHX55

## 2012-06-14 LAB — CBC
HCT: 40.8 % (ref 36.0–46.0)
Hemoglobin: 13.6 g/dL (ref 12.0–15.0)
RBC: 4.48 MIL/uL (ref 3.87–5.11)
WBC: 7.7 10*3/uL (ref 4.0–10.5)

## 2012-06-14 LAB — CREATININE, SERUM
GFR calc Af Amer: >90 mL/min (ref >90)
GFR calc non Af Amer: >90 mL/min (ref >90)

## 2012-06-14 SURGERY — LAPAROSCOPIC REPAIR AND REMOVAL OF GASTRIC BAND
Anesthesia: General | Site: Abdomen | Wound class: Clean

## 2012-06-14 MED ORDER — DEXTROSE 5 % IV SOLN
2.0000 g | INTRAVENOUS | Status: AC
  Administered 2012-06-14: 2 g via INTRAVENOUS
  Filled 2012-06-14: qty 2

## 2012-06-14 MED ORDER — HEPARIN SODIUM (PORCINE) 5000 UNIT/ML IJ SOLN
5000.0000 [IU] | Freq: Three times a day (TID) | INTRAMUSCULAR | Status: DC
  Administered 2012-06-14 – 2012-06-15 (×3): 5000 [IU] via SUBCUTANEOUS
  Filled 2012-06-14 (×6): qty 1

## 2012-06-14 MED ORDER — HYDROMORPHONE HCL PF 1 MG/ML IJ SOLN
0.2500 mg | INTRAMUSCULAR | Status: DC | PRN
  Administered 2012-06-14 (×3): 0.5 mg via INTRAVENOUS

## 2012-06-14 MED ORDER — ACETAMINOPHEN 10 MG/ML IV SOLN
INTRAVENOUS | Status: DC | PRN
  Administered 2012-06-14: 1000 mg via INTRAVENOUS

## 2012-06-14 MED ORDER — MIDAZOLAM HCL 5 MG/5ML IJ SOLN
INTRAMUSCULAR | Status: DC | PRN
  Administered 2012-06-14: 2 mg via INTRAVENOUS

## 2012-06-14 MED ORDER — SODIUM CHLORIDE 0.9 % IJ SOLN
INTRAMUSCULAR | Status: DC | PRN
  Administered 2012-06-14: 10:00:00

## 2012-06-14 MED ORDER — LACTATED RINGERS IV SOLN
INTRAVENOUS | Status: DC
  Administered 2012-06-14: 1000 mL via INTRAVENOUS
  Administered 2012-06-14: 10:00:00 via INTRAVENOUS

## 2012-06-14 MED ORDER — HYDROMORPHONE HCL PF 1 MG/ML IJ SOLN
INTRAMUSCULAR | Status: AC
  Filled 2012-06-14: qty 1

## 2012-06-14 MED ORDER — HYDROCODONE-ACETAMINOPHEN 5-325 MG PO TABS
1.0000 | ORAL_TABLET | ORAL | Status: DC | PRN
  Administered 2012-06-15 (×2): 1 via ORAL
  Filled 2012-06-14 (×3): qty 1

## 2012-06-14 MED ORDER — KCL IN DEXTROSE-NACL 20-5-0.45 MEQ/L-%-% IV SOLN
INTRAVENOUS | Status: DC
  Administered 2012-06-14 – 2012-06-15 (×2): via INTRAVENOUS
  Filled 2012-06-14 (×3): qty 1000

## 2012-06-14 MED ORDER — FENTANYL CITRATE 0.05 MG/ML IJ SOLN
INTRAMUSCULAR | Status: DC | PRN
  Administered 2012-06-14: 50 ug via INTRAVENOUS
  Administered 2012-06-14: 100 ug via INTRAVENOUS
  Administered 2012-06-14 (×2): 50 ug via INTRAVENOUS

## 2012-06-14 MED ORDER — KETOROLAC TROMETHAMINE 30 MG/ML IJ SOLN: 15.0000 mg | Freq: Once | INTRAMUSCULAR | Status: DC | PRN

## 2012-06-14 MED ORDER — ONDANSETRON HCL 4 MG PO TABS: 4.0000 mg | ORAL_TABLET | Freq: Four times a day (QID) | ORAL | Status: DC | PRN

## 2012-06-14 MED ORDER — ONDANSETRON HCL 4 MG/2ML IJ SOLN: 4.0000 mg | Freq: Four times a day (QID) | INTRAMUSCULAR | Status: DC | PRN

## 2012-06-14 MED ORDER — CHLORHEXIDINE GLUCONATE 4 % EX LIQD: 1.0000 "application " | Freq: Once | CUTANEOUS | Status: DC

## 2012-06-14 MED ORDER — ACETAMINOPHEN 10 MG/ML IV SOLN
INTRAVENOUS | Status: AC
  Filled 2012-06-14: qty 100

## 2012-06-14 MED ORDER — LIDOCAINE HCL (CARDIAC) 20 MG/ML IV SOLN
INTRAVENOUS | Status: DC | PRN
  Administered 2012-06-14: 80 mg via INTRAVENOUS

## 2012-06-14 MED ORDER — PROPOFOL 10 MG/ML IV BOLUS
INTRAVENOUS | Status: DC | PRN
  Administered 2012-06-14: 180 mg via INTRAVENOUS

## 2012-06-14 MED ORDER — ONDANSETRON HCL 4 MG/2ML IJ SOLN
INTRAMUSCULAR | Status: DC | PRN
  Administered 2012-06-14: 4 mg via INTRAVENOUS

## 2012-06-14 MED ORDER — LIDOCAINE-EPINEPHRINE 1 %-1:100000 IJ SOLN
INTRAMUSCULAR | Status: DC | PRN
  Administered 2012-06-14 (×2): via INTRAMUSCULAR

## 2012-06-14 MED ORDER — IOHEXOL 300 MG/ML  SOLN
INTRAMUSCULAR | Status: AC
  Filled 2012-06-14: qty 1

## 2012-06-14 MED ORDER — PROMETHAZINE HCL 25 MG/ML IJ SOLN: 6.2500 mg | INTRAMUSCULAR | Status: DC | PRN

## 2012-06-14 MED ORDER — ROCURONIUM BROMIDE 100 MG/10ML IV SOLN
INTRAVENOUS | Status: DC | PRN
  Administered 2012-06-14: 30 mg via INTRAVENOUS
  Administered 2012-06-14: 10 mg via INTRAVENOUS

## 2012-06-14 MED ORDER — KCL IN DEXTROSE-NACL 20-5-0.45 MEQ/L-%-% IV SOLN
INTRAVENOUS | Status: AC
  Filled 2012-06-14: qty 1000

## 2012-06-14 MED ORDER — CEFOXITIN SODIUM-DEXTROSE 1-4 GM-% IV SOLR (PREMIX)
INTRAVENOUS | Status: AC
  Filled 2012-06-14: qty 100

## 2012-06-14 MED ORDER — GLYCOPYRROLATE 0.2 MG/ML IJ SOLN
INTRAMUSCULAR | Status: DC | PRN
  Administered 2012-06-14: 0.6 mg via INTRAVENOUS

## 2012-06-14 MED ORDER — ALPRAZOLAM 0.5 MG PO TABS
0.5000 mg | ORAL_TABLET | ORAL | Status: DC | PRN
  Administered 2012-06-14: 0.5 mg via ORAL
  Filled 2012-06-14: qty 1

## 2012-06-14 MED ORDER — HEPARIN SODIUM (PORCINE) 5000 UNIT/ML IJ SOLN
5000.0000 [IU] | Freq: Once | INTRAMUSCULAR | Status: AC
  Administered 2012-06-14: 5000 [IU] via SUBCUTANEOUS
  Filled 2012-06-14: qty 1

## 2012-06-14 MED ORDER — DEXAMETHASONE SODIUM PHOSPHATE 10 MG/ML IJ SOLN
INTRAMUSCULAR | Status: DC | PRN
  Administered 2012-06-14: 10 mg via INTRAVENOUS

## 2012-06-14 MED ORDER — NEOSTIGMINE METHYLSULFATE 1 MG/ML IJ SOLN
INTRAMUSCULAR | Status: DC | PRN
  Administered 2012-06-14: 4 mg via INTRAVENOUS

## 2012-06-14 MED ORDER — MORPHINE SULFATE 2 MG/ML IJ SOLN
1.0000 mg | INTRAMUSCULAR | Status: DC | PRN
  Administered 2012-06-14 – 2012-06-15 (×7): 1 mg via INTRAVENOUS
  Filled 2012-06-14 (×7): qty 1

## 2012-06-14 SURGICAL SUPPLY — 79 items
APL SKNCLS STERI-STRIP NONHPOA (GAUZE/BANDAGES/DRESSINGS)
APPLIER CLIP 5 13 M/L LIGAMAX5 (MISCELLANEOUS)
APPLIER CLIP ROT 10 11.4 M/L (STAPLE) ×3
APR CLP MED LRG 11.4X10 (STAPLE) ×2
APR CLP MED LRG 5 ANG JAW (MISCELLANEOUS)
BAG SPEC RTRVL LRG 6X4 10 (ENDOMECHANICALS) ×2
BENZOIN TINCTURE PRP APPL 2/3 (GAUZE/BANDAGES/DRESSINGS) ×1 IMPLANT
BLADE HEX COATED 2.75 (ELECTRODE) ×3 IMPLANT
BLADE SURG 15 STRL LF DISP TIS (BLADE) ×2 IMPLANT
BLADE SURG 15 STRL SS (BLADE) ×3
CABLE HIGH FREQUENCY MONO STRZ (ELECTRODE) ×2 IMPLANT
CANISTER SUCTION 2500CC (MISCELLANEOUS) ×3 IMPLANT
CATH REDDICK CHOLANGI 4FR 50CM (CATHETERS) ×2 IMPLANT
CLIP APPLIE 5 13 M/L LIGAMAX5 (MISCELLANEOUS) IMPLANT
CLIP APPLIE ROT 10 11.4 M/L (STAPLE) ×1 IMPLANT
CLOTH BEACON ORANGE TIMEOUT ST (SAFETY) ×3 IMPLANT
COVER MAYO STAND STRL (DRAPES) ×3 IMPLANT
COVER SURGICAL LIGHT HANDLE (MISCELLANEOUS) ×3 IMPLANT
DECANTER SPIKE VIAL GLASS SM (MISCELLANEOUS) ×6 IMPLANT
DEVICE SUT QUICK LOAD TK 5 (STAPLE) ×3 IMPLANT
DEVICE SUT TI-KNOT TK 5X26 (MISCELLANEOUS) ×1 IMPLANT
DEVICE SUTURE ENDOST 10MM (ENDOMECHANICALS) IMPLANT
DEVICE TROCAR PUNCTURE CLOSURE (ENDOMECHANICALS) ×2 IMPLANT
DISSECTOR BLUNT TIP ENDO 5MM (MISCELLANEOUS) IMPLANT
DRAPE C-ARM 42X72 X-RAY (DRAPES) ×3 IMPLANT
DRAPE CAMERA CLOSED 9X96 (DRAPES) ×3 IMPLANT
DRAPE LAPAROSCOPIC ABDOMINAL (DRAPES) ×3 IMPLANT
ELECT REM PT RETURN 9FT ADLT (ELECTROSURGICAL) ×3
ELECTRODE REM PT RTRN 9FT ADLT (ELECTROSURGICAL) ×2 IMPLANT
GLOVE BIOGEL M 8.0 STRL (GLOVE) ×3 IMPLANT
GLOVE BIOGEL PI IND STRL 7.0 (GLOVE) ×2 IMPLANT
GLOVE BIOGEL PI INDICATOR 7.0 (GLOVE) ×1
GOWN STRL NON-REIN LRG LVL3 (GOWN DISPOSABLE) ×5 IMPLANT
GOWN STRL REIN XL XLG (GOWN DISPOSABLE) ×10 IMPLANT
HEMOSTAT SURGICEL 4X8 (HEMOSTASIS) IMPLANT
HOVERMATT SINGLE USE (MISCELLANEOUS) ×1 IMPLANT
IV CATH 14GX2 1/4 (CATHETERS) ×3 IMPLANT
KIT BASIN OR (CUSTOM PROCEDURE TRAY) ×3 IMPLANT
NDL SPNL 22GX3.5 QUINCKE BK (NEEDLE) ×1 IMPLANT
NEEDLE SPNL 22GX3.5 QUINCKE BK (NEEDLE) ×3 IMPLANT
NS IRRIG 1000ML POUR BTL (IV SOLUTION) ×3 IMPLANT
PACK UNIVERSAL I (CUSTOM PROCEDURE TRAY) ×3 IMPLANT
PENCIL BUTTON HOLSTER BLD 10FT (ELECTRODE) ×3 IMPLANT
POUCH SPECIMEN RETRIEVAL 10MM (ENDOMECHANICALS) ×3 IMPLANT
SCALPEL HARMONIC ACE (MISCELLANEOUS) IMPLANT
SET CHOLANGIOGRAPH MIX (MISCELLANEOUS) IMPLANT
SET IRRIG TUBING LAPAROSCOPIC (IRRIGATION / IRRIGATOR) ×3 IMPLANT
SHEARS CURVED HARMONIC AC 45CM (MISCELLANEOUS) ×2 IMPLANT
SLEEVE ADV FIXATION 5X100MM (TROCAR) IMPLANT
SLEEVE Z-THREAD 5X100MM (TROCAR) IMPLANT
SOLUTION ANTI FOG 6CC (MISCELLANEOUS) ×3 IMPLANT
SPONGE GAUZE 4X4 12PLY (GAUZE/BANDAGES/DRESSINGS) ×3 IMPLANT
SPONGE LAP 18X18 X RAY DECT (DISPOSABLE) ×1 IMPLANT
STAPLER VISISTAT 35W (STAPLE) ×3 IMPLANT
STRIP CLOSURE SKIN 1/2X4 (GAUZE/BANDAGES/DRESSINGS) ×1 IMPLANT
SUT ETHIBOND 2 0 SH (SUTURE)
SUT ETHIBOND 2 0 SH 36X2 (SUTURE) ×3 IMPLANT
SUT PROLENE 2 0 CT2 30 (SUTURE) ×1 IMPLANT
SUT SILK 0 (SUTURE)
SUT SILK 0 30XBRD TIE 6 (SUTURE) IMPLANT
SUT SURGIDAC NAB ES-9 0 48 120 (SUTURE) IMPLANT
SUT VIC AB 2-0 SH 27 (SUTURE)
SUT VIC AB 2-0 SH 27X BRD (SUTURE) IMPLANT
SUT VIC AB 4-0 SH 18 (SUTURE) ×3 IMPLANT
SYR 20CC LL (SYRINGE) ×3 IMPLANT
SYR 30ML LL (SYRINGE) ×3 IMPLANT
SYS KII OPTICAL ACCESS 15MM (TROCAR) ×3
SYSTEM KII OPTICAL ACCESS 15MM (TROCAR) ×2 IMPLANT
TOWEL OR 17X26 10 PK STRL BLUE (TOWEL DISPOSABLE) ×4 IMPLANT
TRAY LAP CHOLE (CUSTOM PROCEDURE TRAY) ×3 IMPLANT
TROCAR ADV FIXATION 11X100MM (TROCAR) IMPLANT
TROCAR BLADELESS OPT 5 75 (ENDOMECHANICALS) ×2 IMPLANT
TROCAR XCEL BLUNT TIP 100MML (ENDOMECHANICALS) IMPLANT
TROCAR XCEL NON-BLD 11X100MML (ENDOMECHANICALS) ×3 IMPLANT
TROCAR Z-THREAD FIOS 11X100 BL (TROCAR) ×2 IMPLANT
TROCAR Z-THREAD FIOS 5X100MM (TROCAR) ×3 IMPLANT
TROCAR Z-THREAD SLEEVE 11X100 (TROCAR) IMPLANT
TUBE CALIBRATION LAPBAND (TUBING) ×3 IMPLANT
TUBING INSUFFLATION 10FT LAP (TUBING) ×3 IMPLANT

## 2012-06-14 NOTE — H&P (Signed)
Chief Complaint: Symptomatic gallstones and lapband dysfunction  History of Present Illness: Samantha Tyler is an 41 y.o. female who recently spent the evening being worked up for RUQ pain and was found to have gallstones. We had some discussions on her last visit and she wanted to have her band removed because the weight loss she experienced had caused her to have proximal dysphasia and also what she felt was exacerbation of her multiple sclerosis symptoms. She has thought about this and has decided she wants to have her band removed at the same time as her gallbladder removal.  Informed consent was obtained regarding laparoscopic cholecystectomy including complications not limited to, bile duct injury and bile leaks. She is aware of the complications of laparoscopy having had her lap band done this past May.  We'll proceed with scheduling laparoscopic cholecystectomy with concomitant removal of her lap band. Upper endoscopy yesterday by Dr. Ezzard Standing was reviewed and was negative for erosion or obstruction.  Past Medical History   Diagnosis  Date   .  Hyperlipidemia    .  Depression    .  History of colon polyps    .  Multiple sclerosis  12-23-11     dx.12'09-Recent tx. Tysabri(last IV tx. 09-21-11), only Ritalin now   .  Fatigue    .  Hernia    .  Colon polyps    .  History of colon polyps    .  Contact lens/glasses fitting    .  Chronic kidney disease      hx of kidney stones   .  Gallbladder & bile duct stone, nonacute cholecystitis     Past Surgical History   Procedure  Date   .  Hernia repair  02/1995     inguinal   .  Breath tek h pylori  10/23/2011     Procedure: BREATH TEK H PYLORI; Surgeon: Valarie Merino, MD; Location: [facility]; Service: General; Laterality: N/A;   .  Laparoscopic gastric banding  12/29/2011     Procedure: LAPAROSCOPIC GASTRIC BANDING; Surgeon: Valarie Merino, MD; Location: [facility]; Service: General; Laterality: N/A;   .  Colonoscopy    .   Esophagogastroduodenoscopy  05/12/2012     Procedure: ESOPHAGOGASTRODUODENOSCOPY (EGD); Surgeon: Kandis Cocking, MD; Location: [facility]; Service: General; Laterality: N/A;    Current Outpatient Prescriptions   Medication  Sig  Dispense  Refill   .  ALPRAZolam (XANAX) 0.5 MG tablet  Take 0.5 mg by mouth 2 (two) times daily as needed.     .  Fingolimod HCl (GILENYA) 0.5 MG CAPS  Take 1 capsule by mouth daily.      No current facility-administered medications for this visit.    Facility-Administered Medications Ordered in Other Visits   Medication  Dose  Route  Frequency  Provider  Last Rate  Last Dose   .  DISCONTD: 0.9 % sodium chloride infusion   Intravenous  Continuous  Kandis Cocking, MD  20 mL/hr at 05/12/12 1200     Tape  Family History   Problem  Relation  Age of Onset   .  Hyperlipidemia  Mother    .  Hypertension  Mother    .  Hypothyroidism  Mother    .  Hyperlipidemia  Father    .  Hypertension  Father     Social History: reports that she has never smoked. She has never used smokeless tobacco. She reports that she does not drink alcohol or  use illicit drugs.  REVIEW OF SYSTEMS - PERTINENT POSITIVES ONLY:  noncontributory  Physical Exam:  Blood pressure 100/58, pulse 76, temperature 97.2 F (36.2 C), temperature source Temporal, resp. rate 16, height 5\' 9"  (1.753 m), weight 193 lb (87.544 kg), last menstrual period 04/18/2012.  Body mass index is 28.50 kg/(m^2).  Gen: WDWN WF NAD  Neurological: Alert and oriented to person, place, and time. Motor and sensory function is grossly intact  Head: Normocephalic and atraumatic.  Eyes: Conjunctivae are normal. Pupils are equal, round, and reactive to light. No scleral icterus.  Neck: Normal range of motion. Neck supple. No tracheal deviation or thyromegaly present.  Cardiovascular: SR without murmurs or gallops. No carotid bruits  Respiratory: Effort normal. No respiratory distress. No chest wall tenderness. Breath sounds  normal. No wheezes, rales or rhonchi.  Abdomen: nontender at present but having intermittent right sided pain  GU:  Musculoskeletal: Normal range of motion. Extremities are nontender. No cyanosis, edema or clubbing noted Lymphadenopathy: No cervical, preauricular, postauricular or axillary adenopathy is present Skin: Skin is warm and dry. No rash noted. No diaphoresis. No erythema. No pallor. Pscyh: Normal mood and affect. Behavior is normal. Judgment and thought content normal.  LABORATORY RESULTS:  No results found for this or any previous visit (from the past 48 hour(s)).  RADIOLOGY RESULTS:  No results found.  Problem List:  Patient Active Problem List   Diagnosis   .  HYPERLIPIDEMIA   .  HYPERSOMNIA, PERSISTENT   .  ALLERGIC RHINITIS   .  MS (multiple sclerosis)   .  Obesity, Class III, BMI 40-49.9 (morbid obesity)   .  Lapband APS with hiatus hernia repair May 2013   .  Dysphagia   .  Gallstones    Assessment & Plan:  History of cholecystitis and concern regarding her lapband and her dysphagia which may have nothing to do with the lapband since it occurred six weeks after the surgery. The concern is that it is related to her MS exacerbation.  Lap chole and removal of lapband. There has been no change in the patient's past medical history or physical exam in the past 24 hours to the best of my knowledge. I examined the patient in the holding area and have made any changes to the history and physical exam report that is included above.   Expectations and outcome results have been discussed with the patient to include risks and benefits.  All questions have been answered and we will proceed with previously discussed procedure noted and signed in the consent form in the patient's record.    Somer Trotter BMD FACS 8:52 AM  06/14/2012 [facility]  [phone] beeper  [phone]

## 2012-06-14 NOTE — Op Note (Signed)
Samantha Tyler @date @  Procedure: Laparoscopic Cholecystectomy with intraoperative cholangiogram & removal of lapband and lapband port  Surgeon: Wenda Low, MD, FACS Asst:  Ovidio Kin, MD, FACS  Anes:  General  Drains: None  Findings: Well placed lapband without apparent complications  Description of Procedure: The patient was taken to OR 1 and given general anesthesia.  The patient was prepped with PCMX and draped sterilely. A time out was performed.  Access to the abdomen was achieved with a 5 mm in the LUQ.  Port placement included multiple 5mm trocars and one 15 mm trocar placed obliquely through the port site.  The band was isolated before doing anything else, unbuckled and the tubing cut.  The port was then removed and a 15 mm trocar placed there and then the other remaining piece of the band including the buckle was removed without difficulty.    The gallbladder was visualized and the fundus was grasped and the gallbladder was elevated. Traction on the infundibulum allowed for successful demonstration of the critical view. Inflammatory changes were chronic and minimal.  The cystic duct was identified and clipped up on the gallbladder and an incision was made in the cystic duct and the Reddick catheter was inserted after milking the cystic duct of any debris. A dynamic cholangiogram was performed which demonstrated good filling of the intrahepatic radicals and free flow into the duodenum.     The cystic duct was then triple clipped and divided, the cystic artery was double clipped and divided and then the gallbladder was removed from the gallbladder bed. Removal of the gallbladder from the gallbladder bed was straightforward.  The gallbladder was then placed in a bag and brought out through  the 15 mm trocar sites. The gallbladder bed was inspected and no bleeding or bile leaks were seen.   Laparoscopic visualization was used when closing fascial defects for the 15 mm trocar site.    Incisions were injected with marcaine and lidocaine and closed with 4-0 Vicryl and Dermabond on the skin.  Sponge and needle count were correct.    The patient was taken to the recovery room in satisfactory condition.

## 2012-06-14 NOTE — Anesthesia Postprocedure Evaluation (Signed)
  Anesthesia Post-op Note  Patient: Samantha Tyler  Procedure(s) Performed: Procedure(s) (LRB): LAPAROSCOPIC REPAIR AND REMOVAL OF GASRIC BAND (N/A) LAPAROSCOPIC CHOLECYSTECTOMY WITH INTRAOPERATIVE CHOLANGIOGRAM (N/A)  Patient Location: PACU  Anesthesia Type: General  Level of Consciousness: awake and alert   Airway and Oxygen Therapy: Patient Spontanous Breathing  Post-op Pain: mild  Post-op Assessment: Post-op Vital signs reviewed, Patient's Cardiovascular Status Stable, Respiratory Function Stable, Patent Airway and No signs of Nausea or vomiting  Post-op Vital Signs: stable  Complications: No apparent anesthesia complications

## 2012-06-14 NOTE — Anesthesia Preprocedure Evaluation (Signed)
Anesthesia Evaluation  Patient identified by MRN, date of birth, ID band Patient awake    Reviewed: Allergy & Precautions, H&P , NPO status , Patient's Chart, lab work & pertinent test results  Airway Mallampati: II TM Distance: <3 FB Neck ROM: Full    Dental No notable dental hx.    Pulmonary neg pulmonary ROS,  breath sounds clear to auscultation  Pulmonary exam normal       Cardiovascular negative cardio ROS  Rhythm:Regular Rate:Normal     Neuro/Psych MS negative psych ROS   GI/Hepatic negative GI ROS, Neg liver ROS,   Endo/Other  Morbid obesity  Renal/GU negative Renal ROS  negative genitourinary   Musculoskeletal negative musculoskeletal ROS (+)   Abdominal   Peds negative pediatric ROS (+)  Hematology negative hematology ROS (+)   Anesthesia Other Findings   Reproductive/Obstetrics negative OB ROS                           Anesthesia Physical Anesthesia Plan  ASA: III  Anesthesia Plan: General   Post-op Pain Management:    Induction: Intravenous  Airway Management Planned: Oral ETT  Additional Equipment:   Intra-op Plan:   Post-operative Plan: Extubation in OR  Informed Consent: I have reviewed the patients History and Physical, chart, labs and discussed the procedure including the risks, benefits and alternatives for the proposed anesthesia with the patient or authorized representative who has indicated his/her understanding and acceptance.   Dental advisory given  Plan Discussed with: CRNA and Surgeon  Anesthesia Plan Comments:         Anesthesia Quick Evaluation

## 2012-06-14 NOTE — Transfer of Care (Signed)
Immediate Anesthesia Transfer of Care Note  Patient: Samantha Tyler  Procedure(s) Performed: Procedure(s) (LRB) with comments: LAPAROSCOPIC REPAIR AND REMOVAL OF GASRIC BAND (N/A) - Orders Read Removal of Lapband LAPAROSCOPIC CHOLECYSTECTOMY WITH INTRAOPERATIVE CHOLANGIOGRAM (N/A)  Patient Location: PACU  Anesthesia Type: General  Level of Consciousness: awake, alert  and oriented  Airway & Oxygen Therapy: Patient Spontanous Breathing and Patient connected to face mask oxygen  Post-op Assessment: Report given to PACU RN and Post -op Vital signs reviewed and stable  Post vital signs: Reviewed and stable  Complications: No apparent anesthesia complications

## 2012-06-15 ENCOUNTER — Encounter (HOSPITAL_COMMUNITY): Payer: Self-pay | Admitting: Surgery

## 2012-06-15 DIAGNOSIS — Z09 Encounter for follow-up examination after completed treatment for conditions other than malignant neoplasm: Secondary | ICD-10-CM

## 2012-06-15 MED ORDER — OXYCODONE-ACETAMINOPHEN 5-325 MG/5ML PO SOLN: 5.0000 mL | ORAL | Status: DC | PRN

## 2012-06-15 NOTE — Care Management Note (Signed)
    Page 1 of 1   06/15/2012     11:03:19 AM   CARE MANAGEMENT NOTE 06/15/2012  Patient:  Samantha Tyler, Samantha Tyler   Account Number:  [id number]  Date Initiated:  06/15/2012  Documentation initiated by:  Lorenda Ishihara  Subjective/Objective Assessment:   41 yo female admitted s/p lap chole with lap band removal. PTA lived at home with parent.     Action/Plan:   d/c home when stable   Anticipated DC Date:  06/15/2012   Anticipated DC Plan:  HOME/SELF CARE      DC Planning Services  CM consult      Choice offered to / List presented to:             Status of service:  Completed, signed off Medicare Important Message given?   (If response is "NO", the following Medicare IM given date fields will be blank) Date Medicare IM given:   Date Additional Medicare IM given:    Discharge Disposition:  HOME/SELF CARE  Per UR Regulation:  Reviewed for med. necessity/level of care/duration of stay  If discussed at Long Length of Stay Meetings, dates discussed:    Comments:

## 2012-06-15 NOTE — Progress Notes (Signed)
VASCULAR LAB PRELIMINARY  PRELIMINARY  PRELIMINARY  PRELIMINARY  Bilateral lower extremity venous duplex  completed.    Preliminary report:  Bilateral:  No evidence of DVT, superficial thrombosis, or Baker's Cyst.   Joy Reiger, RVT 06/15/2012, 12:31 PM

## 2012-06-15 NOTE — Discharge Summary (Signed)
Physician Discharge Summary  Patient ID: Samantha Tyler MRN: [id number] DOB/AGE: 1971/02/19 41 y.o.  Admit date: 06/14/2012 Discharge date: 06/15/2012  Admission Diagnoses:  Gallstones and dysphagia from lapband;  Weight loss thought to be complicating Multiple Sclerosis  Discharge Diagnoses:  same  Active Problems:  * No active hospital problems. *    Surgery:  Lap chole with IOC and removal of lapband  Discharged Condition: improved  Hospital Course:  Had surgery.  Kept overnight.  Concerned about DVT in her legs-Doppler study ordered.  Plan discharge after venous study   Consults: none  Significant Diagnostic Studies: Venous study of lower extremity    Discharge Exam: Blood pressure 111/62, pulse 85, temperature 98.3 F (36.8 C), temperature source Oral, resp. rate 18, height 5\' 9"  (1.753 m), weight 198 lb 14 oz (90.21 kg), last menstrual period 06/13/2012, SpO2 100.00%. None tender.  Eating ok.  No calf swelling but pt having discomfort.   Disposition: 01-Home or Self Care  Discharge Orders    Future Appointments: Provider: Department: Dept Phone: Center:   07/13/2012 11:50 AM Valarie Merino, MD [facility] [phone] None     Future Orders Please Complete By Expires   Diet Carb Modified      Increase activity slowly      No wound care          Medication List     As of 06/15/2012 10:27 AM    TAKE these medications         ALPRAZolam 0.5 MG tablet   Commonly known as: XANAX   Take 0.5 mg by mouth as needed. For anxiety patient only takes at bedtime as needed      CALCIUM 500 1250 MG tablet   Generic drug: calcium carbonate   Take 1 tablet by mouth 3 (three) times daily.      multivitamin with minerals Tabs   Take 1 tablet by mouth daily.      oxyCODONE-acetaminophen 5-325 MG/5ML solution   Commonly known as: ROXICET   Take 5 mLs by mouth every 4 (four) hours as needed for pain.           Follow-up Information    Follow up with  Darrell Hauk B, MD. In 3 weeks.   Contact information:   [address] [place] [address] [phone]          Signed: Valarie Merino 06/15/2012, 10:27 AM

## 2012-06-22 ENCOUNTER — Encounter: Payer: 59 | Attending: Surgery | Admitting: *Deleted

## 2012-06-22 ENCOUNTER — Encounter: Payer: Self-pay | Admitting: *Deleted

## 2012-06-22 ENCOUNTER — Encounter (INDEPENDENT_AMBULATORY_CARE_PROVIDER_SITE_OTHER): Payer: Self-pay | Admitting: Surgery

## 2012-06-22 VITALS — Ht 69.0 in | Wt 172.4 lb

## 2012-06-22 DIAGNOSIS — Z713 Dietary counseling and surveillance: Secondary | ICD-10-CM | POA: Insufficient documentation

## 2012-06-22 DIAGNOSIS — Z01818 Encounter for other preprocedural examination: Secondary | ICD-10-CM | POA: Insufficient documentation

## 2012-06-22 NOTE — Progress Notes (Addendum)
Follow-up visit:  1 Week Post-Operative LAGB Removal and Cholecystectomy  Medical Nutrition Therapy:  Appt start time:  1230   End time:  1305.  Primary concerns today: Post-operative Bariatric Surgery Nutrition Management. Samantha Tyler returns today for f/u 1 week s/p lapband removal and cholecystectomy. States she still has feeling of food/fluid sticking in her throat, though hopes it will resolve. Is curious if she may have a hiatal hernia as she was told she did by a tech during a recent EGD. Reports that her MS exacerbation seems to be resolving. Would like help with weight maintenance.   Weight today: 172.4 lb  Weight change: 29.6 lbs Total weight lost: 110.6 lbs BMI: 25.5 kg/m^2  TANITA  BODY COMP RESULTS  03/17/12 04/14/12   %Fat 45.5% 43.1%   Fat Mass (lbs) 96.5 87.0   Fat Free Mass (lbs) 116.0 115.0   Total Body Water (lbs) 85.0 84.0   Medications: See medication list.  Recent physical activity:  None reported d/t recent lapband removal and cholecystecomy  Progress Towards Goal(s):  In progress.   Nutritional Diagnosis:  NI-2.1 Inadequate oral intake related to recent surgery as evidenced by patient report of decreased PO intake d/t feeling that food gets stuck in throat and nausea.   Intervention:  Nutrition education/reinforcement.  Monitoring/Evaluation:  Dietary intake, exercise, and body weight. Follow up in 4 weeks.

## 2012-06-23 ENCOUNTER — Encounter: Payer: Self-pay | Admitting: *Deleted

## 2012-06-23 NOTE — Patient Instructions (Signed)
Goals:  Eat 3-6 small meals/snacks, every 3-5 hrs  Follow same meal pattern as before; shoot for 1100-1200 calories/day.  Increase lean protein foods to meet 60g goal while healing from surgery. Will reassess at follow-up.  Increase fluid intake to 64oz +  Aim for >30 min of physical activity daily

## 2012-07-13 ENCOUNTER — Encounter (INDEPENDENT_AMBULATORY_CARE_PROVIDER_SITE_OTHER): Payer: Self-pay | Admitting: Surgery

## 2012-07-13 ENCOUNTER — Ambulatory Visit (INDEPENDENT_AMBULATORY_CARE_PROVIDER_SITE_OTHER): Payer: 59 | Admitting: Surgery

## 2012-07-13 ENCOUNTER — Encounter: Payer: Self-pay | Admitting: *Deleted

## 2012-07-13 ENCOUNTER — Encounter: Payer: 59 | Attending: Surgery | Admitting: *Deleted

## 2012-07-13 VITALS — BP 112/56 | HR 68 | Temp 97.1°F | Resp 20 | Ht 69.0 in | Wt 172.0 lb

## 2012-07-13 VITALS — Ht 69.0 in | Wt 172.0 lb

## 2012-07-13 DIAGNOSIS — E66813 Obesity, class 3: Secondary | ICD-10-CM

## 2012-07-13 DIAGNOSIS — Z01818 Encounter for other preprocedural examination: Secondary | ICD-10-CM | POA: Insufficient documentation

## 2012-07-13 DIAGNOSIS — Z713 Dietary counseling and surveillance: Secondary | ICD-10-CM | POA: Insufficient documentation

## 2012-07-13 DIAGNOSIS — Z9884 Bariatric surgery status: Secondary | ICD-10-CM

## 2012-07-13 NOTE — Patient Instructions (Addendum)
Goals:  Eat 3-6 small meals/snacks, every 3-5 hrs   1100-1200 calories/day  150 g carbs (30 g per meal and 15 g per snack)  60 g protein  30-40 g fat (<10 g as saturated fat)  Increase fluid intake to 64oz +  Aim for >30 min of physical activity daily

## 2012-07-13 NOTE — Progress Notes (Signed)
Samantha Tyler 41 y.o.  Body mass index is 25.40 kg/(m^2).  Patient Active Problem List  Diagnosis  . HYPERLIPIDEMIA  . HYPERSOMNIA, PERSISTENT  . ALLERGIC RHINITIS  . MS (multiple sclerosis)  . Obesity, Class III, BMI 40-49.9 (morbid obesity)  . Lapband APS with hiatus hernia repair May 2013  . Dysphagia    Allergies  Allergen Reactions  . Tape Rash    Reaction to tape and clear opsite from IV     Past Surgical History  Procedure Date  . Hernia repair 02/1995    inguinal  . Breath tek h pylori 10/23/2011    Procedure: BREATH TEK H PYLORI;  Surgeon: Valarie Merino, MD;  Location: [facility];  Service: General;  Laterality: N/A;  . Laparoscopic gastric banding 12/29/2011    Procedure: LAPAROSCOPIC GASTRIC BANDING;  Surgeon: Valarie Merino, MD;  Location: [facility];  Service: General;  Laterality: N/A;  . Colonoscopy   . Esophagogastroduodenoscopy 05/12/2012    Procedure: ESOPHAGOGASTRODUODENOSCOPY (EGD);  Surgeon: Kandis Cocking, MD;  Location: [facility];  Service: General;  Laterality: N/A;  . Cholecystectomy 06/14/2012    Procedure: LAPAROSCOPIC CHOLECYSTECTOMY WITH INTRAOPERATIVE CHOLANGIOGRAM;  Surgeon: Valarie Merino, MD;  Location: [facility];  Service: General;  Laterality: N/A;   [name], MD No diagnosis found.  [name] comes in today in followup. Her path report showed chronic cholecystitis. She is doing very well. She is seen [name] and hopefully we'll be able to maintain her weight loss. She's continues to be followed by Dr. Tinnie Gens and once in regarding her MS. She is swallowing fine and doing better. At the right be happy to continue to follow her and help her in maintaining her weight loss. Today's weight is 172. Return when necessary Matt B. Daphine Deutscher, MD, [facility] [phone] beeper [phone]  07/13/2012 12:21 PM

## 2012-07-13 NOTE — Patient Instructions (Signed)
See us prn

## 2012-07-13 NOTE — Progress Notes (Addendum)
Follow-up visit:  5 Week Post-Operative LAGB Removal and Cholecystectomy  Medical Nutrition Therapy:  Appt start time:  1000   End time:  1030.  Primary concerns today: Post-operative LAGB Removal Nutrition/Weight Management. Samantha Tyler returns today for f/u 5 week s/p lapband removal and cholecystectomy. Doing very well and reports the food "sticking" issues seem to be resolving. No hernia reported by MD. Samantha Tyler to follow bariatric weight maintenance diet (Phase IV) and is taking her supplements as precaution. Desires to maintain current weight up to <200 lbs.   Weight today: 172.0 lbs  Weight change: + 0.25 lbs Total weight lost: 111.5 lbs BMI: 25.4 kg/m^2  TANITA  BODY COMP RESULTS  03/17/12 04/14/12 07/13/12   %Fat 45.5% 43.1% 32.5%   Fat Mass (lbs) 96.5 87.0 55.5   Fat Free Mass (lbs) 116.0 115.0 115.0   Total Body Water (lbs) 85.0 84.0 84.0   24-hour dietary recall: B: 1/2 banana, 1 T peanut butter   S: Protein shake (Nectar w/ water) S: Greek yogurt w/ 0.5 oz almonds L: 6 grilled chicken nuggets, fruit cup ([employer]) S: Protein bar D: 3 oz chicken w/ green beans or fruit S: 4 oz Greek yogurt  Medications: See medication list. Supplements: Continues to follow LAGB guidelines  Recent physical activity: Some walking; No consistent exercise noted since recent lapband removal and cholecystecomy  Progress Towards Goal(s):  In progress.   Nutritional Diagnosis:  NI-2.1 Inadequate oral intake related to recent surgery as evidenced by patient report of decreased PO intake d/t feeling that food gets stuck in throat and nausea.   Intervention:  Nutrition education/reinforcement.  Monitoring/Evaluation:  Dietary intake, exercise, and body weight. Follow up in 5 weeks.

## 2012-08-19 ENCOUNTER — Encounter: Payer: Self-pay | Admitting: *Deleted

## 2012-08-19 ENCOUNTER — Encounter: Payer: 59 | Attending: Surgery | Admitting: *Deleted

## 2012-08-19 VITALS — Ht 69.0 in | Wt 162.0 lb

## 2012-08-19 DIAGNOSIS — Z713 Dietary counseling and surveillance: Secondary | ICD-10-CM | POA: Insufficient documentation

## 2012-08-19 DIAGNOSIS — Z01818 Encounter for other preprocedural examination: Secondary | ICD-10-CM | POA: Insufficient documentation

## 2012-08-19 DIAGNOSIS — E66813 Obesity, class 3: Secondary | ICD-10-CM

## 2012-08-19 NOTE — Progress Notes (Signed)
Follow-up visit:  10 Week Post-Operative LAGB Removal and Cholecystectomy  Medical Nutrition Therapy:  Appt start time:  1000   End time:  1030.  Primary concerns today: Post-operative LAGB Removal Nutrition/Weight Management. Samantha Tyler returns today for f/u s/p lapband removal and cholecystectomy with an additional 10 lb wt loss. Desires to maintain current weight up to <200 lbs. Doing very well and reports food "sticking" issue has almost completely resolved. Continues to follow bariatric weight maintenance diet (Phase IV) and is taking her supplements as precaution.   Weight today: 162.0 lbs  Weight change: 10.0 lbs Total weight lost: 121.5 lbs BMI: 23.9 kg/m^2    TANITA  BODY COMP RESULTS  03/17/12 04/14/12 07/13/12 08/19/12   Fat Mass (lbs) 96.5 87.0 55.5 48.5   Fat Free Mass (lbs) 116.0 115.0 115.0 113.5   Total Body Water (lbs) 85.0 84.0 84.0 83.0   24-hour dietary recall: (NO CHANGES TO INTAKE) B: 1/2 banana, 1 T peanut butter   S: Protein shake (Nectar w/ water) S: Greek yogurt w/ 0.5 oz almonds L: 6 grilled chicken nuggets, fruit cup ([employer]) S: Protein bar D: 3 oz chicken w/ green beans or fruit S: 4 oz Greek yogurt  Medications: No changes reported Supplements: Continues to follow LAGB guidelines  Recent physical activity: No consistent exercise noted since recent lapband removal and cholecystecomy; Plans to start walking asap.  Progress Towards Goal(s):  In progress.   Nutritional Diagnosis:  -3.3 Overweight/obesity related to past poor dietary habits and physical inactivity as evidenced by patient following recommended dietary guidelines for weight maintenance s/p recent LAGB removal.  NI-2.1 Inadequate oral intake related to recent surgery as evidenced by patient report of decreased PO intake d/t feeling that food gets stuck in throat and nausea. RESOLVED   Intervention:  Nutrition education/reinforcement.  Samples given during visit include:  Freedavite  Multivitamin: 2 bottles (5 tabs/bottle) Lot # [id number];  Exp: 01/17  Monitoring/Evaluation:  Dietary intake, exercise, and body weight. Follow up in 3 months.

## 2012-08-19 NOTE — Patient Instructions (Addendum)
Goals:  Eat 3-6 small meals/snacks, every 3-5 hrs   1100-1200 calories/day  150 g carbs (20-30 g per meal and 15 g per snack)  60 g protein  30-40 g fat (<10 g as saturated fat)  Increase fluid intake to 64oz +  Aim for >30 min of physical activity daily

## 2012-10-08 ENCOUNTER — Other Ambulatory Visit: Payer: Self-pay

## 2012-11-17 ENCOUNTER — Encounter: Payer: Self-pay | Admitting: *Deleted

## 2012-11-17 ENCOUNTER — Encounter: Payer: 59 | Attending: Surgery | Admitting: *Deleted

## 2012-11-17 VITALS — Ht 69.0 in | Wt 139.5 lb

## 2012-11-17 DIAGNOSIS — Z713 Dietary counseling and surveillance: Secondary | ICD-10-CM | POA: Insufficient documentation

## 2012-11-17 DIAGNOSIS — E66813 Obesity, class 3: Secondary | ICD-10-CM

## 2012-11-17 NOTE — Patient Instructions (Addendum)
Goals:  Eat 3-6 small meals/snacks, every 3-5 hrs   Ex: Add another pack of almonds, use 1%-2% milk with shake; alternate between Smart bar and Quest bar  1300-1400 calories/day (to maintain)  150-165 g carbs (20-30 g per meal and 15-20 g per snack)  60 g protein  40-45 g fat (<10-12 g as saturated fat)  Increase fluid intake to 64oz +  Aim for >30 min of physical activity daily

## 2012-11-17 NOTE — Progress Notes (Signed)
Follow-up visit:  5 Month Post-Operative LAGB Removal and Cholecystectomy  Medical Nutrition Therapy:  Appt start time:  830   End time:  900.  Primary concerns today: Post-operative LAGB Removal Nutrition/Weight Management. Samantha Tyler returns today for f/u s/p lapband removal and cholecystectomy with an additional 22 lb wt loss. New BMI 20.6.  Discussed increasing calories to prevent further weight loss.  Weight today: 139.5 lbs  Weight change: 22.5 lbs Total weight lost: 144.0 lbs BMI: 20.6 kg/m^2    TANITA  BODY COMP RESULTS  03/17/12 04/14/12 07/13/12 08/19/12 11/17/12   Fat Mass (lbs) 96.5 87.0 55.5 48.5 39.5   Fat Free Mass (lbs) 116.0 115.0 115.0 113.5 100.0   Total Body Water (lbs) 85.0 84.0 84.0 83.0 73.0   24-hour dietary recall:  B: 1/2 banana, 1 T peanut butter   S:  Protein shake (Nectar w/ skim milk) - 8 oz = 30g S:  Protein bar Dance movement psychotherapist) - 10g L:  Pita Delite - Grilled chicken salad with feta cheese/wheat pita S: Greek yogurt w/ 0.62 oz almond pack - 15g D: 3 oz lean protein w/ green beans and fruit or 1 pc bread - 20-25g S: 4 oz Greek yogurt - 12g  Medications: No changes reported Supplements: Continues to follow LAGB guidelines  Recent physical activity: No consistent exercise noted since recent lapband removal and cholecystecomy; Plans to start walking asap.  Progress Towards Goal(s):  In progress.   Nutritional Diagnosis:  [place]-3.3 Overweight/obesity related to past poor dietary habits and physical inactivity as evidenced by patient following recommended dietary guidelines for weight maintenance s/p recent LAGB removal.  NI-2.1 Inadequate oral intake related to recent surgery as evidenced by patient report of decreased PO intake d/t feeling that food gets stuck in throat and nausea. RESOLVED   Intervention:  Nutrition education/reinforcement.  Monitoring/Evaluation:  Dietary intake, exercise, and body weight. Follow up in 3 months.

## 2012-12-02 ENCOUNTER — Encounter (INDEPENDENT_AMBULATORY_CARE_PROVIDER_SITE_OTHER): Payer: Self-pay | Admitting: Surgery

## 2012-12-02 ENCOUNTER — Ambulatory Visit (INDEPENDENT_AMBULATORY_CARE_PROVIDER_SITE_OTHER): Payer: 59 | Admitting: Surgery

## 2012-12-02 VITALS — BP 96/56 | HR 60 | Temp 97.5°F | Resp 16 | Ht 69.0 in | Wt 144.0 lb

## 2012-12-02 DIAGNOSIS — Z9884 Bariatric surgery status: Secondary | ICD-10-CM

## 2012-12-02 NOTE — Patient Instructions (Signed)
Thanks for your patience.  If you need further assistance after leaving the office, please call our office and speak with a [facility] nurse.  [phone].  If you want to leave a message for Dr. Hazley Dezeeuw, please call his office phone at [phone]. 

## 2012-12-02 NOTE — Progress Notes (Signed)
Samantha Tyler 42 y.o.  Body mass index is 21.26 kg/(m^2).  Patient Active Problem List  Diagnosis  . HYPERLIPIDEMIA  . HYPERSOMNIA, PERSISTENT  . ALLERGIC RHINITIS  . MS (multiple sclerosis)  . Obesity, Class III, BMI 40-49.9 (morbid obesity)  . Lapband APS with hiatus hernia repair May 2013  . Dysphagia    Allergies  Allergen Reactions  . Tape Rash    Reaction to tape and clear opsite from IV     Past Surgical History  Procedure Laterality Date  . Hernia repair  02/1995    inguinal  . Breath tek h pylori  10/23/2011    Procedure: BREATH TEK H PYLORI;  Surgeon: Valarie Merino, MD;  Location: [facility];  Service: General;  Laterality: N/A;  . Laparoscopic gastric banding  12/29/2011    Procedure: LAPAROSCOPIC GASTRIC BANDING;  Surgeon: Valarie Merino, MD;  Location: [facility];  Service: General;  Laterality: N/A;  . Colonoscopy    . Esophagogastroduodenoscopy  05/12/2012    Procedure: ESOPHAGOGASTRODUODENOSCOPY (EGD);  Surgeon: Kandis Cocking, MD;  Location: [facility];  Service: General;  Laterality: N/A;  . Cholecystectomy  06/14/2012    Procedure: LAPAROSCOPIC CHOLECYSTECTOMY WITH INTRAOPERATIVE CHOLANGIOGRAM;  Surgeon: Valarie Merino, MD;  Location: [facility];  Service: General;  Laterality: N/A;   [name], MD No diagnosis found.  Samantha Tyler and her father came in today with concerns about her previous lab band port site. I examined her both in the sitting position and supine and with the set up and I cannot feel a hernia. What I do feel his the scar tissue that was secondary to her band port which also had mesh on the back which stimulates a significant  Response.I reassured her and told her I would be happy to see her again as needed. Matt B. Daphine Deutscher, MD, [facility] [phone] beeper [phone]  12/02/2012 3:26 PM

## 2012-12-26 ENCOUNTER — Telehealth: Payer: Self-pay | Admitting: Nurse Practitioner

## 2012-12-26 NOTE — Telephone Encounter (Signed)
Patient calling with update that she still has not had a period since January 2014. Please advise.

## 2012-12-26 NOTE — Telephone Encounter (Signed)
Patient calling to let you know has still had no cycle since January 2014. Please advise. Was seen 11/03/2012 , chart is on your desk. sue

## 2012-12-26 NOTE — Telephone Encounter (Signed)
Since no menses in 3 months = lets do Provera challenge.  Provera 10 mg. daily for 10 days.  Then expect withdrawl bleed within 2 wks.  CB +/- any bleeding and for how long.  If no bleeding let me know.

## 2012-12-27 MED ORDER — MEDROXYPROGESTERONE ACETATE 10 MG PO TABS: 10.0000 mg | ORAL_TABLET | Freq: Every day | ORAL | Status: DC

## 2012-12-27 NOTE — Telephone Encounter (Signed)
Patient notified of response from Shirlyn Goltz, FNP,/ patient understands instructions to CB if any bleeding or not.

## 2013-01-18 ENCOUNTER — Telehealth: Payer: Self-pay | Admitting: Nurse Practitioner

## 2013-01-18 NOTE — Telephone Encounter (Signed)
Since stopping the provera there has been no bleeding.

## 2013-01-25 NOTE — Telephone Encounter (Signed)
Patient calling again to check and see what Samantha Tyler suggests. Status is still the same.

## 2013-01-27 ENCOUNTER — Telehealth: Payer: Self-pay | Admitting: Nurse Practitioner

## 2013-01-27 DIAGNOSIS — N912 Amenorrhea, unspecified: Secondary | ICD-10-CM

## 2013-01-27 NOTE — Telephone Encounter (Signed)
Samantha Tyler was returned a call about amenorrhea since February.  She took Provera challenge without any results.  Discussed with Dr. 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 Advised her to get [facility], Prolactin labs first and follow then if all normal wait another month then if no menses will need to be given estrogen to stimulate her cycle.  Patient is in agreement and will schedule labs.  Order is placed in the chart.

## 2013-02-16 ENCOUNTER — Other Ambulatory Visit: Payer: 59

## 2013-02-16 ENCOUNTER — Ambulatory Visit (INDEPENDENT_AMBULATORY_CARE_PROVIDER_SITE_OTHER): Payer: 59 | Admitting: Certified Nurse Midwife

## 2013-02-16 ENCOUNTER — Encounter: Payer: Self-pay | Admitting: Certified Nurse Midwife

## 2013-02-16 VITALS — BP 90/60 | HR 64 | Temp 98.4°F | Ht 69.0 in | Wt 131.0 lb

## 2013-02-16 DIAGNOSIS — B3731 Acute candidiasis of vulva and vagina: Secondary | ICD-10-CM

## 2013-02-16 DIAGNOSIS — B373 Candidiasis of vulva and vagina: Secondary | ICD-10-CM

## 2013-02-16 DIAGNOSIS — N390 Urinary tract infection, site not specified: Secondary | ICD-10-CM

## 2013-02-16 DIAGNOSIS — N912 Amenorrhea, unspecified: Secondary | ICD-10-CM

## 2013-02-16 LAB — POCT URINALYSIS DIPSTICK
Ketones, UA: NEGATIVE
Protein, UA: NEGATIVE
pH, UA: 5

## 2013-02-16 LAB — TSH: TSH: 1.122 u[IU]/mL (ref 0.350–4.500)

## 2013-02-16 MED ORDER — TERCONAZOLE 0.4 % VA CREA: 1.0000 | TOPICAL_CREAM | Freq: Every day | VAGINAL | Status: DC

## 2013-02-16 MED ORDER — NITROFURANTOIN MONOHYD MACRO 100 MG PO CAPS: 100.0000 mg | ORAL_CAPSULE | Freq: Two times a day (BID) | ORAL | Status: DC

## 2013-02-16 NOTE — Progress Notes (Signed)
S: 42 y.o.Single Caucasian female presents with UTI symptoms for the past 4 days. Complaining of frequency, urgency, odor and spontaneous wet feeling. Patient also has MS so wondering if just a symptom of that.  Denies new personal products. Not sexually active at present. Wear a pad every day "just in case" of an accident. Denies fever, chills, headache or backache. Doristine Locks" Also has not had a period since 2/14, she was given Provera without results of bleeding. Concerned, was on lab schedule for labs to evaluate. Denies abdominal or pelvic pain.   O: alert, oriented to person, place, and time, anxious   thin, healthy and well developed  Skin warm and dry  Positive suprapubic tenderness  No CVA tenderness  Pelvic exam: External genital area: normal  Bartholins/Skenes negative  Urethra/bladder tender, Urethral meatus tender and red  No incontinence noted with cough or bearing down  Vagina: thick white discharge, no odor  Wet prep: taken  Cervix:non tender, normal  Uterus: normal, non tender  Adnexa:nomal, non tender, no masses  Perineal area: no lesions  Urine:1+ WBC's Wet Prep: Positive yeast, negative BV, trich    Assessment: UTI vs urethritis 2-Yeast vaginitis 3-MS on medication 4-No evidence of incontinence today 5-Amenorrhea probably related to weight loss, with normal pelvic exam   Plan:Reviewed findings and discussed stopping continued pad use. Try changing underwear if feels moist or using re- usable padded underwear Rx Macrobid see order 2-Reviewed findings and this maybe the moisture she is noticing. Rx Terazol 7 see order 3-Discussed aware of bladder involvement with MS, but evidence with exam. 5-Discussed normal pelvic exam and weight, thyroid and pituitary involvement in amenorrhea. Recommend labs as ordered. Will discuss plan when labs in. Lab: TSH,FSH,Prolactin Urine culture TOC if culture positive at 2 weeks  RV prn/as above Reviewed, TL

## 2013-02-17 ENCOUNTER — Ambulatory Visit: Payer: 59 | Admitting: *Deleted

## 2013-02-17 LAB — PROLACTIN: Prolactin: 3.3 ng/mL

## 2013-02-20 ENCOUNTER — Other Ambulatory Visit: Payer: Self-pay

## 2013-02-22 ENCOUNTER — Telehealth: Payer: Self-pay | Admitting: Certified Nurse Midwife

## 2013-02-22 NOTE — Telephone Encounter (Signed)
Patient is calling for lab results.

## 2013-02-22 NOTE — Telephone Encounter (Signed)
    Patient calling for lab results 

## 2013-02-23 ENCOUNTER — Telehealth: Payer: Self-pay | Admitting: Certified Nurse Midwife

## 2013-02-23 NOTE — Telephone Encounter (Signed)
Patient is calling for recent lab results. (pt called yesterday also)

## 2013-02-23 NOTE — Telephone Encounter (Signed)
Patient is calling for recent lab results °

## 2013-02-27 ENCOUNTER — Telehealth: Payer: Self-pay | Admitting: Certified Nurse Midwife

## 2013-02-27 ENCOUNTER — Ambulatory Visit: Payer: 59 | Admitting: *Deleted

## 2013-02-27 NOTE — Telephone Encounter (Signed)
Routed to Ms. Eunice Blase

## 2013-02-27 NOTE — Telephone Encounter (Signed)
Was routed to DL on 4/0/98

## 2013-02-27 NOTE — Telephone Encounter (Signed)
Patient wants to discuss results of her labs.

## 2013-02-27 NOTE — Telephone Encounter (Signed)
Samantha Tyler is managing patient and has called and spoke to her

## 2013-02-27 NOTE — Telephone Encounter (Signed)
Per Leota Sauers, pg is managing patient & has talked to her. See other encounter

## 2013-02-28 ENCOUNTER — Telehealth: Payer: Self-pay | Admitting: Nurse Practitioner

## 2013-02-28 DIAGNOSIS — N912 Amenorrhea, unspecified: Secondary | ICD-10-CM

## 2013-02-28 NOTE — Telephone Encounter (Signed)
Patient was called back about her amenorrhea.  We had decided to put her on Lo Loestrin since her labs were all normal.  She then stated she had a superficial phlebitis in 12/09.  We did not have that information in her chart and I held the RX to discuss with Dr. Hyacinth Meeker.  We have decided to do PUS and check the endometrial lining and see what that result is then decide on treatment. Will send the order for PUS and if she has questions to call back.

## 2013-02-28 NOTE — Telephone Encounter (Signed)
Patient concerned of FSH results. Stated she spoke to Shirlyn Goltz , who is to discuss with Dr. Hyacinth Meeker. Please advise.

## 2013-03-08 ENCOUNTER — Other Ambulatory Visit: Payer: Self-pay | Admitting: *Deleted

## 2013-03-08 DIAGNOSIS — N912 Amenorrhea, unspecified: Secondary | ICD-10-CM

## 2013-03-10 ENCOUNTER — Ambulatory Visit: Payer: 59 | Admitting: *Deleted

## 2013-03-20 ENCOUNTER — Other Ambulatory Visit: Payer: 59 | Admitting: Obstetrics & Gynecology

## 2013-03-20 ENCOUNTER — Other Ambulatory Visit: Payer: 59

## 2013-03-27 ENCOUNTER — Ambulatory Visit: Payer: 59 | Admitting: *Deleted

## 2013-03-30 ENCOUNTER — Ambulatory Visit (INDEPENDENT_AMBULATORY_CARE_PROVIDER_SITE_OTHER): Payer: 59

## 2013-03-30 ENCOUNTER — Ambulatory Visit (INDEPENDENT_AMBULATORY_CARE_PROVIDER_SITE_OTHER): Payer: 59 | Admitting: Obstetrics & Gynecology

## 2013-03-30 DIAGNOSIS — N912 Amenorrhea, unspecified: Secondary | ICD-10-CM

## 2013-03-30 DIAGNOSIS — R634 Abnormal weight loss: Secondary | ICD-10-CM

## 2013-03-30 NOTE — Progress Notes (Signed)
42 y.o.Singlefemale here for a pelvic ultrasound.  H/o amenorrhea since 2/14.  Did Provera challenge.  Had normal FSH, TSH, and prolactin.  History of significant weight loss after having lap band placed. After around 60 pounds of weight loss, felt lump in throat and also had exacerbation of MS symptoms.  Neurologist recommended lap band removal.  Around same time, had new cholecystitis symptoms.  Since gall bladder was going to be removed, also had lap band removed.  Has continued to lose some weight since surgery in October, 2013.  Ultimately, had 140 pound weight loss in total.  Patient is a pharmacist.  Was recommended to use estrogen which she declined due to hx of superficial phlebitis.  Have discussed with patient and her mother (who accompanies her today) work-up for amenorrhea.  Thus far, her testing has been normal.      Patient's last menstrual period was 08/21/2012.  Sexually active:  no  Contraception: abstinence  FINDINGS: UTERUS: 5.6 x 4.5 x 3.3cm without fibroids EMS: 3.60mm ADNEXA:   Left ovary 2.7 x 2.1 x 2.0cm   Right ovary 2.4 x 2.3 x 1.7cm, no abnormal findings on either ovary.  Normal blood flow. CUL DE SAC: negative  Results discussed with patient and her mother.  Many questions.  She is worried about the amenorrhea.  Review of chart seems to show she is concerned about many aspects of her health.  Discussed with both appropriate work-up, which has been done, except for estrogen use.  I understand her desire not to use estrogens.  I feel she has amenorrhea due to her significant weight loss, which does seem stable for the past couple of months.  Discussed possible options with patient but for now, we agreed to watch.  Will repeat FSH in 3 months.  If normal, plan repeat provera challenge.  Patient is very comfortable with this.  Assessment:  Amenorrhea, probably due to significant weight loss Plan: Repeat FSH as per above and provera challenge if appropriate.  If patient has  any bleeding, she will call and let me know.    ~25 minutes spent with patient >50% of time was in face to face discussion of above.

## 2013-03-31 ENCOUNTER — Encounter: Payer: Self-pay | Admitting: Obstetrics & Gynecology

## 2013-03-31 NOTE — Patient Instructions (Signed)
Please call if you have any bleeding between now and lab appointment.

## 2013-04-19 ENCOUNTER — Encounter: Payer: 59 | Attending: Surgery | Admitting: Dietician

## 2013-04-19 VITALS — Ht 69.0 in | Wt 128.0 lb

## 2013-04-19 DIAGNOSIS — Z09 Encounter for follow-up examination after completed treatment for conditions other than malignant neoplasm: Secondary | ICD-10-CM | POA: Insufficient documentation

## 2013-04-19 DIAGNOSIS — Z9884 Bariatric surgery status: Secondary | ICD-10-CM | POA: Insufficient documentation

## 2013-04-19 DIAGNOSIS — Z713 Dietary counseling and surveillance: Secondary | ICD-10-CM | POA: Insufficient documentation

## 2013-04-19 NOTE — Patient Instructions (Addendum)
Goals:  Increase portions sizes of protein to 4 oz or more.  Consider having whole English muffins, bananas, or pancakes for breakfast.   Consider having nuts (almonds, peanuts) for snacks throughout the day.  Consider switching to full fat Greek yogurt.  Drink 2-3 glasses of soy milk or 2% milk each day.   Have a leafy green vegetable each day (with some lemon or other acid to help with iron absorption).  Aim for >30 min of physical activity daily  Consider weight bearing exercises to physical activity.

## 2013-04-19 NOTE — Progress Notes (Signed)
Follow-up visit: 10 Month Post-Operative LAGB Removal and Cholecystectomy  Medical Nutrition Therapy:  Appt start time:  1030   End time:  1100.  Primary concerns today: Post-operative LAGB Removal Nutrition/Weight Management. Samantha Tyler returns today for a f/u s/p lapband removal and cholecystectomy with an additional 11.5 lb wt loss in past 5 months. States that she weighs herself everyday and is concerned about regaining weight she has lost. Body fat is 18.5%, BMI is 18.9, and reports not having menstrual periods since 07/2012. States that her weight has been stable for the last 2-2.5 months.    Weight today: 128 lbs  Weight change: 11.5 lbs Total weight lost: 155.5 lbs BMI: 18.9 kg/m^2    TANITA  BODY COMP RESULTS  03/17/12 04/14/12 07/13/12 08/19/12 11/17/12 04/19/2013   Fat Mass (lbs) 96.5 87.0 55.5 48.5 39.5 23.5   Fat Free Mass (lbs) 116.0 115.0 115.0 113.5 100.0 104.5   Total Body Water (lbs) 85.0 84.0 84.0 83.0 73.0 76.5   24-hour dietary recall:  B: 1/2 banana, 1 T peanut butter + half English Muffin or half pancake  S:  Protein shake (Nectar w/ soy milk) - 8 oz = 30g S:  Protein bar Dance movement psychotherapist) - 10g or yogurt L:  Pita Delite - Grilled chicken salad with feta cheese/wheat pita S: Greek yogurt or Protein bar Dance movement psychotherapist) 10g D: 3 oz lean protein w/ green beans and fruit or 1 pc bread - 20-25g S: 4 oz Greek yogurt - 12g  Medications: discontinued Lipitor d/t elevated LFTs Supplements: Continues to follow LAGB guidelines  Recent physical activity: Walk (30-45 min) or Zumba (1 hour) 2-4 x week   Progress Towards Goal(s):  In progress.   Nutritional Diagnosis:  [place]-3.3 Overweight/obesity related to past poor dietary habits and physical inactivity as evidenced by patient following recommended dietary guidelines for weight maintenance s/p recent LAGB removal.  NI-2.1 Inadequate oral intake related to recent surgery as evidenced by patient report of decreased PO intake d/t  feeling that food gets stuck in throat and nausea. RESOLVED   Intervention:  Nutrition education/reinforcement. Discussed increasing calories to prevent further weight loss.  Plan:  Increase portions sizes of protein to 4 oz or more.  Consider having whole English muffins, bananas, or pancakes for breakfast.   Consider having nuts (almonds, peanuts) for snacks throughout the day.  Consider switching to full fat Greek yogurt.  Drink 2-3 glasses of soy milk or 2% milk each day.   Have a leafy green vegetable each day (with some lemon or other acid to help with iron absorption).  Aim for >30 min of physical activity daily  Consider weight bearing exercises to physical activity.   Monitoring/Evaluation:  Dietary intake, exercise, and body weight. Follow up in 6 weeks.

## 2013-06-01 ENCOUNTER — Ambulatory Visit: Payer: 59 | Admitting: Dietician

## 2013-06-29 ENCOUNTER — Other Ambulatory Visit: Payer: Self-pay

## 2013-06-29 ENCOUNTER — Other Ambulatory Visit: Payer: 59

## 2013-06-29 ENCOUNTER — Telehealth: Payer: Self-pay | Admitting: Nurse Practitioner

## 2013-06-29 NOTE — Telephone Encounter (Signed)
Left message for pt to call back and reschedule missed lab appointment from this morning.

## 2013-06-30 ENCOUNTER — Other Ambulatory Visit (INDEPENDENT_AMBULATORY_CARE_PROVIDER_SITE_OTHER): Payer: 59

## 2013-06-30 DIAGNOSIS — N912 Amenorrhea, unspecified: Secondary | ICD-10-CM

## 2013-07-01 LAB — FOLLICLE STIMULATING HORMONE: FSH: 1.1 m[IU]/mL

## 2013-07-05 ENCOUNTER — Telehealth: Payer: Self-pay | Admitting: Emergency Medicine

## 2013-07-05 NOTE — Telephone Encounter (Signed)
Spoke with patient. Message given and patient verbalized understanding.  Would like rx sent to [facility] on [address].

## 2013-07-05 NOTE — Telephone Encounter (Signed)
Message copied by Joeseph Amor on Wed Jul 05, 2013 10:26 AM ------      Message from: [name]      Created: Wed Jul 05, 2013  6:02 AM       Inform [facility] is normal.  Not menopausal.  Will do provera challenge as recommended at last OV.  If no bleeding, I will want to see her again before starting anything else.  Provera 10mg  po x 10 days.  I didn't place order because I didn't want it to be filled by pharmacy and pt called before you get her.  Can you let me know and I will place order?  Thanks. ------

## 2013-07-06 MED ORDER — MEDROXYPROGESTERONE ACETATE 10 MG PO TABS: 10.0000 mg | ORAL_TABLET | Freq: Every day | ORAL | Status: DC

## 2013-07-06 NOTE — Telephone Encounter (Signed)
Left message to advise that rx has been sent with e-confirm receipt.

## 2013-07-06 NOTE — Telephone Encounter (Signed)
Patient says prescription was not called to her pharmacy. Please call patient with status.

## 2013-07-25 NOTE — Telephone Encounter (Signed)
Please forward this question to Dr. Hyacinth Meeker who saw her in August for same problem and did PUS.

## 2013-07-25 NOTE — Telephone Encounter (Signed)
Patient is calling wanting to let patty know how she was doing on her new medication. She said she has had no results.

## 2013-07-25 NOTE — Telephone Encounter (Signed)
Dr. Yehuda Savannah,  Patient has not had a withdrawal bleed post provera. What do you advise for next step?

## 2013-07-26 NOTE — Telephone Encounter (Signed)
I'd like to see her again and repeat some blood work. Not urgent.  Next week is ok.

## 2013-07-26 NOTE — Telephone Encounter (Signed)
Spoke with patient. She is agreeable to OV follow up with Dr. Hyacinth Meeker. Scheduled at her convenience with Dr. Hyacinth Meeker for 12/29.   Routing to provider for final review. Patient agreeable to disposition. Will close encounter

## 2013-07-26 NOTE — Telephone Encounter (Signed)
Call to patient, LMTCB

## 2013-08-14 ENCOUNTER — Encounter: Payer: Self-pay | Admitting: Nurse Practitioner

## 2013-08-21 ENCOUNTER — Ambulatory Visit: Payer: 59 | Admitting: Obstetrics & Gynecology

## 2013-09-11 ENCOUNTER — Ambulatory Visit: Payer: 59 | Admitting: Obstetrics & Gynecology

## 2013-09-11 ENCOUNTER — Telehealth: Payer: Self-pay | Admitting: Obstetrics & Gynecology

## 2013-09-11 NOTE — Telephone Encounter (Signed)
Patient rescheduled her follow up appointment today to 09/12/13 @ 10:30. Patient says she "is stuck at another doctors office".

## 2013-09-12 ENCOUNTER — Encounter: Payer: Self-pay | Admitting: Obstetrics & Gynecology

## 2013-09-12 ENCOUNTER — Ambulatory Visit (INDEPENDENT_AMBULATORY_CARE_PROVIDER_SITE_OTHER): Payer: 59 | Admitting: Obstetrics & Gynecology

## 2013-09-12 ENCOUNTER — Other Ambulatory Visit: Payer: Self-pay | Admitting: Obstetrics & Gynecology

## 2013-09-12 VITALS — BP 90/64 | HR 77 | Resp 16 | Wt 126.0 lb

## 2013-09-12 DIAGNOSIS — N912 Amenorrhea, unspecified: Secondary | ICD-10-CM

## 2013-09-12 DIAGNOSIS — N318 Other neuromuscular dysfunction of bladder: Secondary | ICD-10-CM

## 2013-09-12 DIAGNOSIS — N3281 Overactive bladder: Secondary | ICD-10-CM

## 2013-09-12 DIAGNOSIS — R634 Abnormal weight loss: Secondary | ICD-10-CM

## 2013-09-12 MED ORDER — MIRABEGRON ER 25 MG PO TB24: 25.0000 mg | ORAL_TABLET | Freq: Every day | ORAL | Status: DC

## 2013-09-12 NOTE — Progress Notes (Signed)
43 y.o. Married Caucasian female [id number] here for follow up of amenorrhea that has been present now a year.  Pt's issues started with her bariatric surgery 12/29/11.  Weight then was 263.  Pt did well from a surgery and weight loss perspective but had increased MS symptoms.  Lap band was removed 10/13.  Weight was around 190.  She continued to lose weight and by 7/14, she was down to around 140.  She is now 89 but states she has been stable now for several months.    Last fall, patient had a normal TSH, prolactin, FSH, and HCG.  She also had a normal PUS 03/30/13.  She has done severall provera challenges last year without any bleeding.  Evaluation of amenorrhea has been discussed with patient and next step is usually to place a person on estrogen or OCPs.  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  We discussed this again today.  MRI of sella turcica is next step so I feel we should proceed with this.  Patient knows I feel her amenorrhea is probably from weight loss but I feel at this time, MRI is appropriate.  She agrees with plan.    In reviewing hx, I did learn that she has changed MS medications twice in the past year or so.  I did review MS medications while she was here and I cannot fine menstrual irregualries as a side effect.  Pt has lost a total of 137 pounds.  She reports her weight has stabilized and she is comfortable where she is although she regrets the decision to proceed with bariatric surgery in the first place.  Patient also wants to talk about OAB.  Has been on several medications.  Currently on Toviaz.  She feels she could have better control.  Also does have some constipation with this.  Discussed Merbetriq.  Side effect of hypertension discussed.  Pt has normal BPs.  O: Healthy WD,WN female, thin Affect: normal Skin: fine facial hair but increased amount  A:Amenorrhea most likely from excessive weight  loss MS OAB  P:  MRI of sella turcica for complete evaluation. Trial of myrbetriq 25mg .  Rx sent and savings card given.  Pt knows may need prior authorization.  ~25 minutes spent with patient >50% of time was in face to face discussion of above.     P: Discussed findings of   Labs   Instructions given regarding:  RV =  Vesicare not covered.  Toviaz causes constipation.  Doesn't give her as good of control as she likes. myrbetriq 25mg  daily.  rx given. Estradiol MRI to be scheduled

## 2013-09-13 DIAGNOSIS — N3281 Overactive bladder: Secondary | ICD-10-CM | POA: Insufficient documentation

## 2013-09-13 LAB — ESTRADIOL: Estradiol: 20.7 pg/mL

## 2013-09-13 LAB — FOLLICLE STIMULATING HORMONE: FSH: 1.3 m[IU]/mL

## 2013-09-15 ENCOUNTER — Telehealth: Payer: Self-pay | Admitting: Emergency Medicine

## 2013-09-18 NOTE — Telephone Encounter (Signed)
Encounter opened erroneously.   Closed encounter.   

## 2013-09-21 ENCOUNTER — Encounter: Payer: Self-pay | Admitting: Obstetrics & Gynecology

## 2013-09-22 ENCOUNTER — Telehealth: Payer: Self-pay | Admitting: Emergency Medicine

## 2013-09-22 DIAGNOSIS — N912 Amenorrhea, unspecified: Secondary | ICD-10-CM

## 2013-09-22 NOTE — Telephone Encounter (Signed)
Attempted clinical insurance review. Advised Peer to peer needed at this time:   Call: [phone] Press option 3 Case ID # [id number]  MRI Brain with and wo contrast.

## 2013-09-22 NOTE — Telephone Encounter (Signed)
Spoke with MRI technologist at [facility].  Advised to order MRI Brain with and WO Contrast and note specifically pituitary and sella Turcica. They will do pituitary protocol.

## 2013-09-22 NOTE — Telephone Encounter (Signed)
Patient notified that prior auth for brain MRI has been completed and auth number given to [facility].  Advised this is not a guarantee of payment or benefits.  Advised precert for Myrbetriq has been faxed.  Routing to provider for final review. Patient agreeable to disposition. Will close encounter

## 2013-09-22 NOTE — Telephone Encounter (Signed)
[phone]  MRI brain with and without contrast.  Good for 45 days.   Prior authorization done and faxed for her Myrbetriq 25mg 

## 2013-09-22 NOTE — Telephone Encounter (Signed)
Patient is returning call to [place]. °

## 2013-09-22 NOTE — Telephone Encounter (Signed)
Call to [facility] and Rinaldo Cloud notified of prior auth number.

## 2013-09-22 NOTE — Telephone Encounter (Signed)
Appointment scheduled for MRI Brain with and wo contrast, for 09/28/13 at 4:30.  Sent for precert.  Message left to return call to [place] at [phone].   Need to check with patient if is claustrophobic or has any internal devices.

## 2013-09-22 NOTE — Telephone Encounter (Signed)
Message copied by Joeseph AmorFAST, Fady Stamps L on Fri Sep 22, 2013  8:11 AM ------      Message from: Jerene BearsMILLER, Pailyn S      Created: Wed Sep 20, 2013 12:19 PM      Regarding: pt who needs mri       French Anaracy,      This is pt who needs the MRI of her Sella Turcica.  Dx:  Amenorrhea.  I don't know what order to put exactly.  [facility], I will put the orders in.  Thanks.  I will probably need a peer to peer review, but we'll see.            MSM ------

## 2013-09-28 ENCOUNTER — Other Ambulatory Visit: Payer: 59

## 2013-09-28 ENCOUNTER — Ambulatory Visit
Admission: RE | Admit: 2013-09-28 | Discharge: 2013-09-28 | Disposition: A | Discharge status: Home / Self Care | Payer: 59 | Source: Ambulatory Visit | Attending: Obstetrics & Gynecology | Admitting: Obstetrics & Gynecology

## 2013-09-28 DIAGNOSIS — N912 Amenorrhea, unspecified: Secondary | ICD-10-CM

## 2013-09-28 MED ORDER — GADOBENATE DIMEGLUMINE 529 MG/ML IV SOLN
6.0000 mL | Freq: Once | INTRAVENOUS | Status: AC | PRN
  Administered 2013-09-28: 6 mL via INTRAVENOUS

## 2013-11-09 ENCOUNTER — Ambulatory Visit: Payer: Self-pay | Admitting: Nurse Practitioner

## 2013-11-13 ENCOUNTER — Encounter: Payer: Self-pay | Admitting: Nurse Practitioner

## 2013-11-13 NOTE — Progress Notes (Signed)
This encounter was created in error - please disregard.

## 2013-11-14 ENCOUNTER — Telehealth: Payer: Self-pay | Admitting: Nurse Practitioner

## 2013-11-14 NOTE — Telephone Encounter (Signed)
Patient was sick yesterday wave DNKA per starla.

## 2013-11-14 NOTE — Telephone Encounter (Signed)
Done

## 2013-11-30 ENCOUNTER — Ambulatory Visit: Payer: Self-pay | Admitting: Nurse Practitioner

## 2013-12-01 IMAGING — MG MM DIGITAL SCREENING BILAT W/ CAD
4 series · 4 of 4 positions shown · non-contrast
Comparison: none

DG SCREEN MAMMOGRAM BILATERAL
Bilateral CC and MLO view(s) were taken.

DIGITAL SCREENING MAMMOGRAM WITH CAD:
There are scattered fibroglandular densities.  No masses or malignant type calcifications are 
identified.  Compared with prior studies.
Images were processed with CAD.

[R CC]
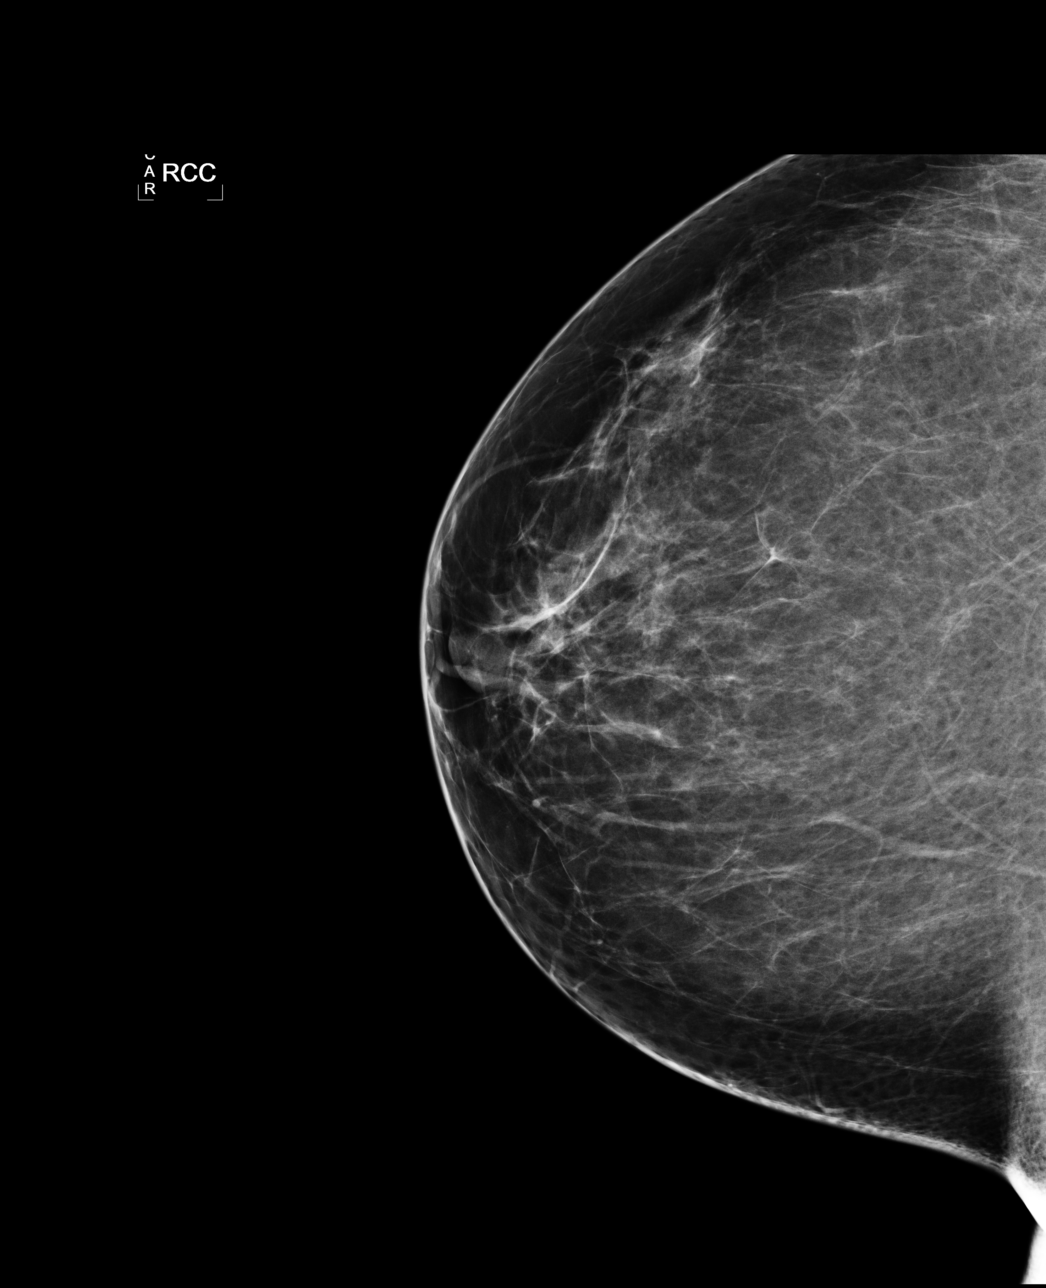

[L CC]
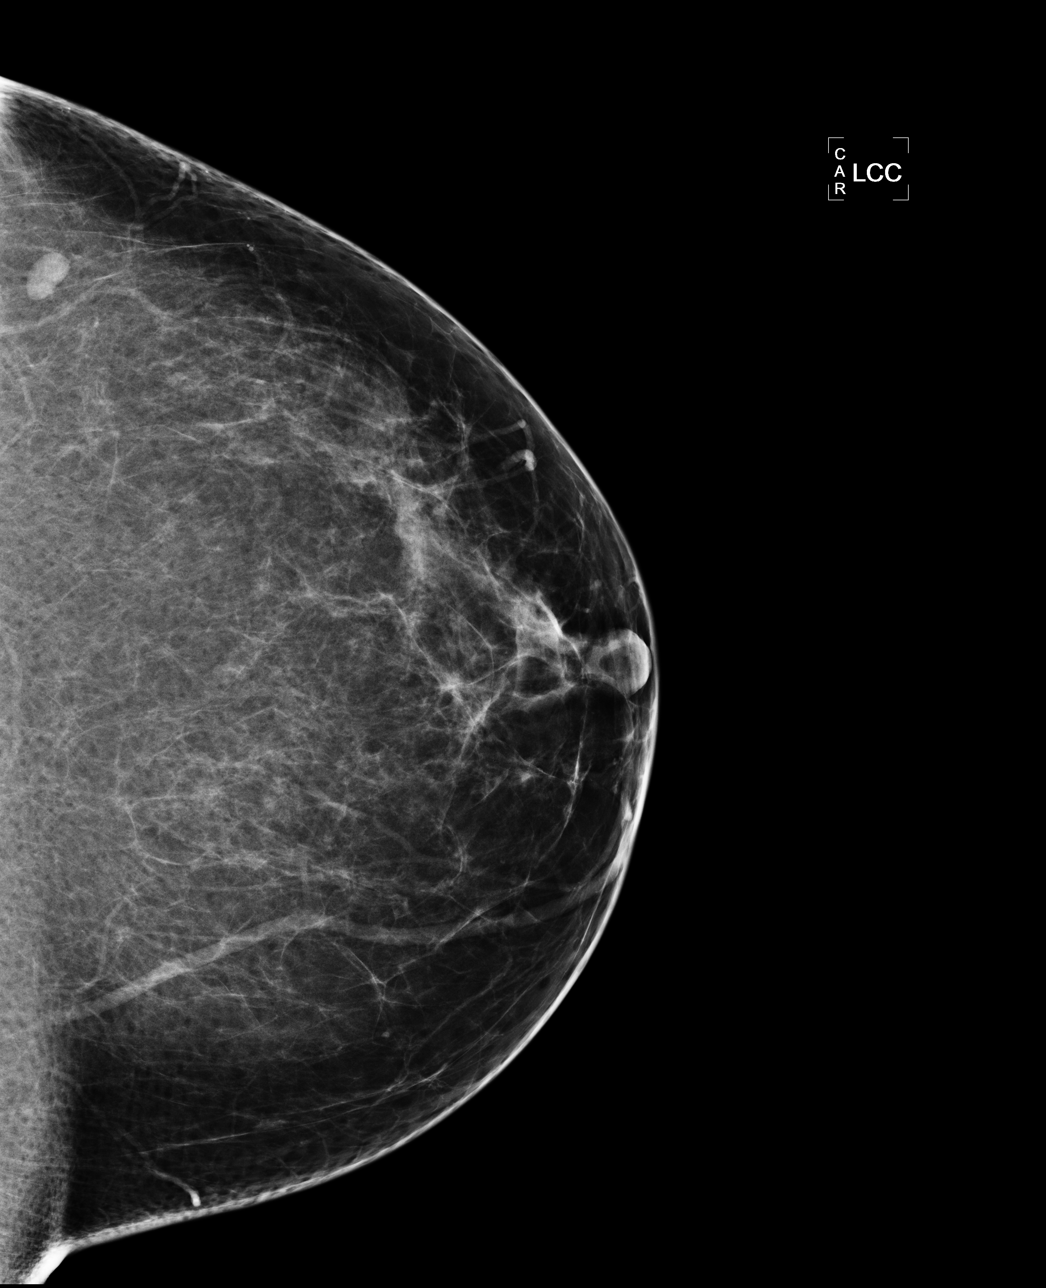

[L MLO]
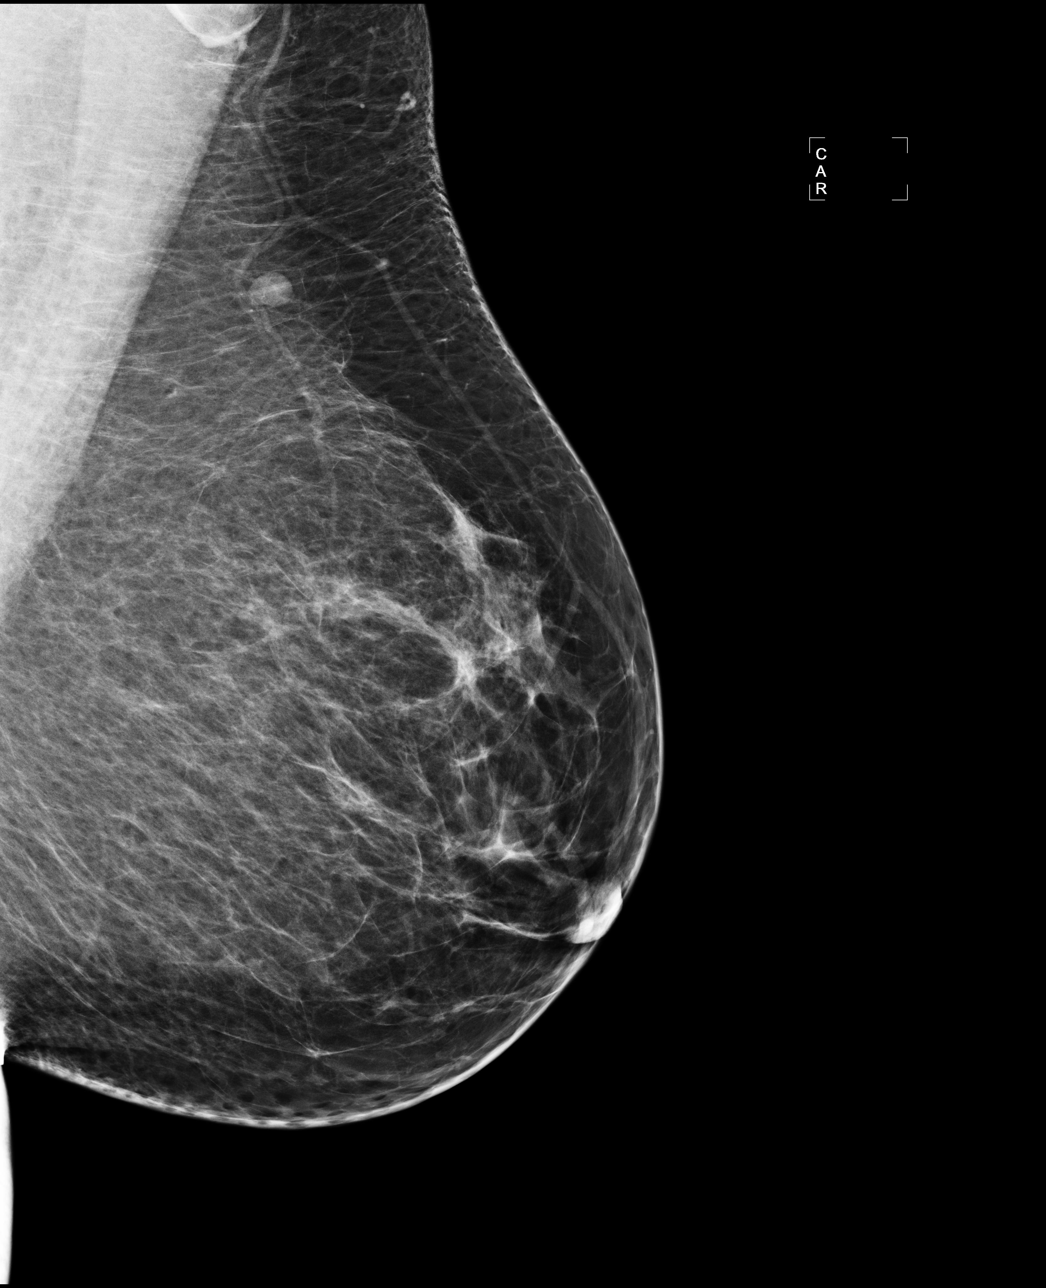

[R MLO]
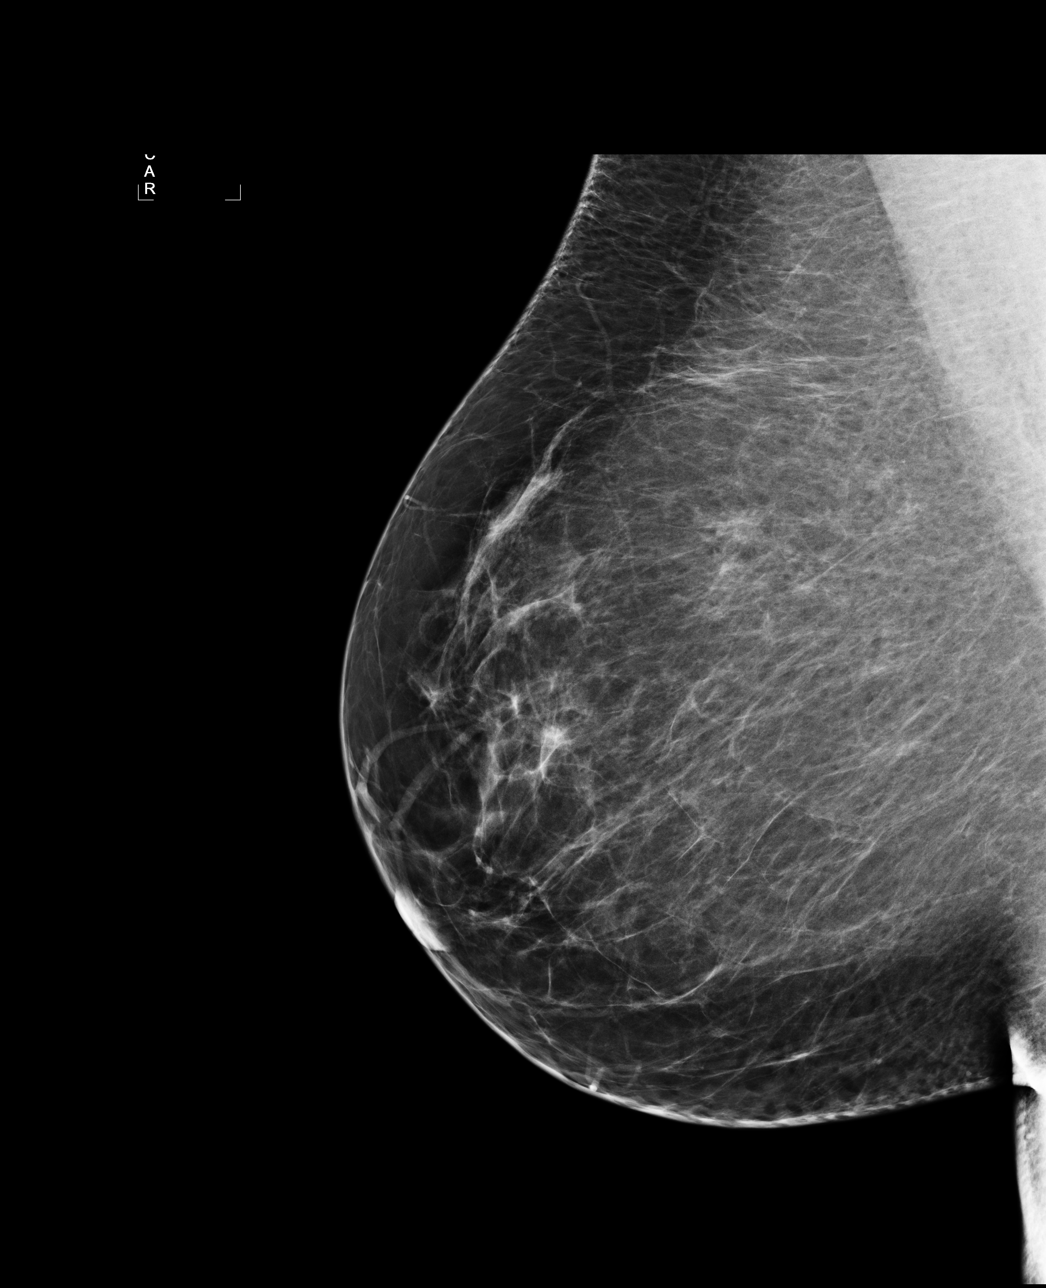

[4 of 4 positions shown; findings below may reference images not displayed]

IMPRESSION: No specific mammographic evidence of malignancy.  Next screening mammogram is recommended in one 
year.

A result letter of this screening mammogram will be mailed directly to the patient.

ASSESSMENT: Negative - BI-RADS 1

Screening mammogram in 1 year.
,

## 2013-12-25 ENCOUNTER — Encounter: Payer: Self-pay | Admitting: Nurse Practitioner

## 2013-12-25 ENCOUNTER — Ambulatory Visit (INDEPENDENT_AMBULATORY_CARE_PROVIDER_SITE_OTHER): Payer: 59 | Admitting: Nurse Practitioner

## 2013-12-25 VITALS — BP 90/62 | HR 64 | Ht 69.0 in | Wt 118.0 lb

## 2013-12-25 DIAGNOSIS — Z0000 Encounter for general adult medical examination without abnormal findings: Secondary | ICD-10-CM

## 2013-12-25 DIAGNOSIS — T8130XA Disruption of wound, unspecified, initial encounter: Secondary | ICD-10-CM

## 2013-12-25 DIAGNOSIS — Z01419 Encounter for gynecological examination (general) (routine) without abnormal findings: Secondary | ICD-10-CM

## 2013-12-25 LAB — POCT URINALYSIS DIPSTICK
BILIRUBIN UA: NEGATIVE
Blood, UA: NEGATIVE
GLUCOSE UA: NEGATIVE
Ketones, UA: NEGATIVE
Leukocytes, UA: NEGATIVE
NITRITE UA: NEGATIVE
Protein, UA: NEGATIVE
Urobilinogen, UA: NEGATIVE
pH, UA: 8

## 2013-12-25 MED ORDER — SULFAMETHOXAZOLE-TMP DS 800-160 MG PO TABS: 1.0000 | ORAL_TABLET | Freq: Two times a day (BID) | ORAL | Status: DC

## 2013-12-25 NOTE — Progress Notes (Signed)
Patient ID: Samantha Tyler, female   DOB: 04/26/1971, 43 y.o.   MRN: [id number] 43 y.o. G0P0 Single Caucasian Fe here for annual exam.  At visit in January wt was 126 lb. Having problems with constipation and takes Miralax.  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 Not dating or SA.   Still no menses and no PMS symptoms.  She is having problems with neuropathy of both hands and is seeing neurologist. She also describes area on both feet and ankles after she had a pedicure on April 17 th.  She thinks the solution that was to treat heel callused got around her ankles and she had a reaction.  She has applied neosporin and several other med's without help.   Patient's last menstrual period was 08/17/2012.          Sexually active: no  The current method of family planning is none.    Exercising: yes  walking and zumba Smoker:  no  Health Maintenance: Pap:  11/03/12, WNL, neg HR HPV MMG:  09/16/11, Bi-Rads 1: negative Colonoscopy:  01/2010, negative, repeat in 5 years TDaP:  2007 Labs:  HB:  12.9  Urine:  Negative    reports that she has never smoked. She has never used smokeless tobacco. She reports that she does not drink alcohol or use illicit drugs.  Past Medical History  Diagnosis Date  . Hyperlipidemia   . Depression   . History of colon polyps   . Multiple sclerosis 12-23-11    dx.12'09-Recent tx. Tysabri(last IV tx. 09-21-11), only Ritalin now  . Fatigue   . Hernia   . Colon polyps   . History of colon polyps   . Contact lens/glasses fitting   . Gallbladder & bile duct stone, nonacute cholecystitis   . Peripheral vascular disease     varicose veins   . Neuromuscular disorder     MS   . H/O renal calculi 7/10  & 5/12    X 2    Past Surgical History  Procedure Laterality Date  . Hernia repair  02/1995    inguinal  . Breath tek h pylori  10/23/2011    Procedure: BREATH TEK H PYLORI;  Surgeon: Valarie MerinoMatthew B Martin, MD;  [facility];  Service: General;  Laterality: N/A;  .  Laparoscopic gastric banding  12/29/2011    Procedure: LAPAROSCOPIC GASTRIC BANDING;  Surgeon: Valarie MerinoMatthew B Martin, MD;  Location: [facility];  Service: General;  Laterality: N/A;  . Colonoscopy    . Esophagogastroduodenoscopy  05/12/2012    Procedure: ESOPHAGOGASTRODUODENOSCOPY (EGD);  Surgeon: Kandis Cockingavid H Newman, MD;  [facility];  Service: General;  Laterality: N/A;  . Cholecystectomy  06/14/2012    Procedure: LAPAROSCOPIC CHOLECYSTECTOMY WITH INTRAOPERATIVE CHOLANGIOGRAM;  Surgeon: Valarie MerinoMatthew B Martin, MD;  Location: [facility];  Service: General;  Laterality: N/A;  . Banding removed      Current Outpatient Prescriptions  Medication Sig Dispense Refill  . ADDERALL XR 15 MG 24 hr capsule Take 1 capsule by mouth daily.      . calcium carbonate (CALCIUM 500) 1250 MG tablet Take 1 tablet by mouth 3 (three) times daily.       . DiphenhydrAMINE HCl (BENADRYL PO) Take by mouth as needed.      . DULoxetine (CYMBALTA) 60 MG capsule Take 1 capsule by mouth daily.      Marland Kitchen. FLUoxetine (PROZAC) 20 MG capsule Take 1 capsule by mouth daily.      Marland Kitchen. HYDROcodone-acetaminophen (NORCO/VICODIN) 5-325 MG per  tablet Take by mouth as needed.      Marland Kitchen LYRICA 50 MG capsule Take 1 capsule by mouth daily.      . montelukast (SINGULAIR) 10 MG tablet       . Multiple Vitamin (MULTIVITAMIN WITH MINERALS) TABS Take 1 tablet by mouth daily.      . pravastatin (PRAVACHOL) 40 MG tablet Take 1 tablet by mouth daily.      . TECFIDERA 240 MG CPDR Take 1 capsule by mouth 2 (two) times daily.       . TOVIAZ 8 MG TB24 tablet Take 1 tablet by mouth daily.      Marland Kitchen triamcinolone cream (KENALOG) 0.1 % as directed.      . sulfamethoxazole-trimethoprim (BACTRIM DS) 800-160 MG per tablet Take 1 tablet by mouth 2 (two) times daily.  14 tablet  0   No current facility-administered medications for this visit.    Family History  Problem Relation Age of Onset  . Hyperlipidemia Mother   . Hypertension Mother   . Hypothyroidism Mother   .  Hyperlipidemia Father   . Hypertension Father   . Heart disease Father   . Heart disease Brother 50    stent placement    ROS:  Pertinent items are noted in HPI.  Otherwise, a comprehensive ROS was negative.  Exam:   BP 90/62  Pulse 64  Ht 5\' 9"  (1.753 m)  Wt 118 lb (53.524 kg)  BMI 17.42 kg/m2  LMP 08/17/2012 Height: 5\' 9"  (175.3 cm)  Ht Readings from Last 3 Encounters:  12/25/13 5\' 9"  (1.753 m)  04/19/13 5\' 9"  (1.753 m)  02/16/13 5\' 9"  (1.753 m)    General appearance: alert, cooperative and appears older age with her extreme weight loss.  Head: Normocephalic, without obvious abnormality, atraumatic Neck: no adenopathy, supple, symmetrical, trachea midline and thyroid normal to inspection and palpation Lungs: clear to auscultation bilaterally Breasts: normal appearance, no masses or tenderness, hardly no breast tissue left Heart: regular rate and rhythm Abdomen: soft, non-tender; no masses,  no organomegaly Extremities: extremities normal, atraumatic, no edema, lesions around both ankles and forefoot.  Band aid on the right and wound culture is taken from this area. Unable to touch her hands as they hurt too much!.  Recent vein treatment on right leg with bruising. Skin: Skin color, texture, turgor normal. No rashes or lesions other than noted above. Lymph nodes: Cervical, supraclavicular, and axillary nodes normal. no abnormal inguinal nodes palpated Neurologic: Grossly normal   Pelvic: External genitalia:  no lesions              Urethra:  normal appearing urethra with no masses, tenderness or lesions              Bartholin's and Skene's: normal                 Vagina: very atrophic appearing vagina with pale color and no discharge, no lesions              Cervix: anteverted              Pap taken: no Bimanual Exam:  Uterus:  normal size, contour, position, consistency, mobility, non-tender              Adnexa: no mass, fullness, tenderness               Rectovaginal:  Confirms               Anus:  normal  sphincter tone, no lesions  A:  Well Woman with normal exam  Amenorrhea secondary to weight loss  Wound infections around both ankles and fore foot  Neuropathy ? Etiology  Immune  Compromised  Atrophic vaginitis  history of hypercholesterolemia    P:   Reviewed health and wellness pertinent to exam  Pap smear not taken today  Wound culture to R/O MRSA  Will start her on Septra BID in the interim  Mammogram due now and will schedule  Counseled on breast self exam, mammography screening, adequate intake of calcium and vitamin D, diet and exercise return annually or prn  An After Visit Summary was printed and given to the patient.

## 2013-12-25 NOTE — Patient Instructions (Signed)

## 2013-12-26 LAB — HEMOGLOBIN, FINGERSTICK: HEMOGLOBIN, FINGERSTICK: 12.9 g/dL (ref 12.0–16.0)

## 2013-12-28 LAB — WOUND CULTURE
GRAM STAIN: NONE SEEN
Gram Stain: NONE SEEN
Gram Stain: NONE SEEN

## 2014-06-27 ENCOUNTER — Telehealth: Payer: Self-pay | Admitting: Obstetrics & Gynecology

## 2014-06-27 NOTE — Telephone Encounter (Signed)
Patient called and cancelled her six month recheck with Dr. Hyacinth Meeker for 06/28/14 due to coverage at work. She will call back to reschedule at a later date. FYI only.

## 2014-06-28 ENCOUNTER — Ambulatory Visit: Payer: 59 | Admitting: Obstetrics & Gynecology

## 2014-07-02 NOTE — Telephone Encounter (Signed)
Routing to Dr. Hyacinth MeekerMiller as Lorain ChildesFYI. Okay to close?

## 2014-07-03 NOTE — Telephone Encounter (Signed)
This is fine.  Thank you.  Encounter closed. 

## 2016-06-10 ENCOUNTER — Encounter: Payer: Self-pay | Admitting: Nurse Practitioner

## 2016-06-16 ENCOUNTER — Ambulatory Visit (INDEPENDENT_AMBULATORY_CARE_PROVIDER_SITE_OTHER): Payer: 59 | Admitting: Nurse Practitioner

## 2016-06-16 ENCOUNTER — Encounter: Payer: Self-pay | Admitting: Nurse Practitioner

## 2016-06-16 VITALS — Ht 69.0 in | Wt 169.0 lb

## 2016-06-16 DIAGNOSIS — Z113 Encounter for screening for infections with a predominantly sexual mode of transmission: Secondary | ICD-10-CM | POA: Diagnosis not present

## 2016-06-16 DIAGNOSIS — Z0000 Encounter for general adult medical examination without abnormal findings: Secondary | ICD-10-CM | POA: Diagnosis not present

## 2016-06-16 DIAGNOSIS — N760 Acute vaginitis: Secondary | ICD-10-CM

## 2016-06-16 DIAGNOSIS — G35 Multiple sclerosis: Secondary | ICD-10-CM

## 2016-06-16 DIAGNOSIS — Z01411 Encounter for gynecological examination (general) (routine) with abnormal findings: Secondary | ICD-10-CM

## 2016-06-16 DIAGNOSIS — N907 Vulvar cyst: Secondary | ICD-10-CM

## 2016-06-16 MED ORDER — SULFAMETHOXAZOLE-TRIMETHOPRIM 800-160 MG PO TABS: 1.0000 | ORAL_TABLET | Freq: Two times a day (BID) | ORAL | 0 refills | Status: DC

## 2016-06-16 MED ORDER — NORETHINDRONE 0.35 MG PO TABS: 1.0000 | ORAL_TABLET | Freq: Every day | ORAL | 3 refills | Status: DC

## 2016-06-16 NOTE — Patient Instructions (Signed)

## 2016-06-16 NOTE — Progress Notes (Signed)
Patient ID: Samantha Tyler, female   DOB: December 02, 1970, 45 y.o.   MRN: [id number]  45 y.o. G0P0000 Single  Caucasian Fe here for annual exam.  Menses is now regular since she has gained back some weight.  Last year at 118 lb and now at 169 lbs.  Menses are 28 days and last 4-5 days.  A little heavier than she used to be but tolerable.  She is still followed at the weight loss clinic.  History of colon polyps and gets colonoscopy every 5 yrs since age 63.  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  She is now dating a high school classmate for past 10 months.  They discussed sexual history and he denied any history of anything.  Now that she has a vulvar bump - he now admits to being exposed to HPV.  She is very frustrated and wants to be tested for everything. The vulvar bump came up a few days ago and is slight painful but no exudate.  She denies fever or chills and no urinary symptoms.  She is now working as Teacher, early years/pre at [employer] and with [employer] [employer] [facility] [employer].  Patient's last menstrual period was 06/06/2016 (exact date).          Sexually active: Yes.    The current method of family planning is condoms most of the time.    Exercising: No.  The patient does not participate in regular exercise at present. Smoker:  no  Health Maintenance: Pap:  11/03/12, Negative with neg HR HPV MMG: 05/28/15, Bi-Rads 1: Negative (Care Everywhere) Colonoscopy: 06/17/15, tubular adenoma, repeat in 5 years (Pathology in Care Everywhere) TDaP: 2007 HIV: will be done today Labs: 04/2016 with PCP in Care Everywhere   reports that she has never smoked. She has never used smokeless tobacco. She reports that she does not drink alcohol or use drugs.  Past Medical History:  Diagnosis Date  . Colon polyps   . Contact lens/glasses fitting   . Depression   . Fatigue   . Gallbladder & bile duct stone, nonacute cholecystitis   . H/O renal calculi 7/10  & 5/12   X 2  . Hernia   . History of colon polyps   . History of  colon polyps   . Hyperlipidemia   . Multiple sclerosis (HCC) 12-23-11   dx.12'09-Recent tx. Tysabri(last IV tx. 09-21-11), only Ritalin now  . Neuromuscular disorder (HCC)    MS   . Peripheral vascular disease (HCC)    varicose veins     Past Surgical History:  Procedure Laterality Date  . banding removed    . BREATH TEK H PYLORI  10/23/2011   Procedure: BREATH TEK H PYLORI;  Surgeon: Valarie Merino, MD;  Location[facility];  Service: General;  Laterality: N/A;  . CHOLECYSTECTOMY  06/14/2012   Procedure: LAPAROSCOPIC CHOLECYSTECTOMY WITH INTRAOPERATIVE CHOLANGIOGRAM;  Surgeon: Valarie Merino, MD;  Location: [facility];  Service: General;  Laterality: N/A;  . COLONOSCOPY    . ESOPHAGOGASTRODUODENOSCOPY  05/12/2012   Procedure: ESOPHAGOGASTRODUODENOSCOPY (EGD);  Surgeon: Kandis Cocking, MD;  [facility];  Service: General;  Laterality: N/A;  . HERNIA REPAIR  02/1995   inguinal  . LAPAROSCOPIC GASTRIC BANDING  12/29/2011   Procedure: LAPAROSCOPIC GASTRIC BANDING;  Surgeon: Valarie Merino, MD;  Location: [facility];  Service: General;  Laterality: N/A;    Current Outpatient Prescriptions  Medication Sig Dispense Refill  . ADDERALL XR 15 MG 24 hr capsule Take 1 capsule  by mouth daily.    . calcium carbonate (CALCIUM 500) 1250 MG tablet Take 1 tablet by mouth 3 (three) times daily.     . DiphenhydrAMINE HCl (BENADRYL PO) Take by mouth as needed.    . DULoxetine (CYMBALTA) 60 MG capsule Take 1 capsule by mouth daily.    Marland Kitchen FLUoxetine (PROZAC) 20 MG capsule Take 1 capsule by mouth daily.    Marland Kitchen GILENYA 0.5 MG CAPS Take 1 tablet by mouth daily.    Marland Kitchen HYDROcodone-acetaminophen (NORCO/VICODIN) 5-325 MG per tablet Take by mouth as needed.    Marland Kitchen LYRICA 50 MG capsule Take 1 capsule by mouth daily.    . Multiple Vitamin (MULTIVITAMIN WITH MINERALS) TABS Take 1 tablet by mouth daily.    . pravastatin (PRAVACHOL) 40 MG tablet Take 1 tablet by mouth daily.    Marland Kitchen sulfamethoxazole-trimethoprim  (BACTRIM DS) 800-160 MG per tablet Take 1 tablet by mouth 2 (two) times daily. 14 tablet 0  . topiramate (TOPAMAX) 100 MG tablet Take 1 tablet by mouth 2 (two) times daily.    . TOVIAZ 8 MG TB24 tablet Take 1 tablet by mouth daily.    Marland Kitchen triamcinolone cream (KENALOG) 0.1 % as directed.     No current facility-administered medications for this visit.     Family History  Problem Relation Age of Onset  . Hyperlipidemia Mother   . Hypertension Mother   . Hypothyroidism Mother   . Cancer Mother     pituitary  . Hyperlipidemia Father   . Hypertension Father   . Heart disease Father   . Heart disease Brother 50    stent placement    ROS:  Pertinent items are noted in HPI.  Otherwise, a comprehensive ROS was negative.  Exam:   Ht 5\' 9"  (1.753 m)   Wt 169 lb (76.7 kg)   LMP 06/06/2016 (Exact Date)   BMI 24.96 kg/m  Height: 5\' 9"  (175.3 cm) Ht Readings from Last 3 Encounters:  06/16/16 5\' 9"  (1.753 m)  12/25/13 5\' 9"  (1.753 m)  04/19/13 5\' 9"  (1.753 m)    General appearance: alert, cooperative and appears stated age Head: Normocephalic, without obvious abnormality, atraumatic Neck: no adenopathy, supple, symmetrical, trachea midline and thyroid normal to inspection and palpation Lungs: clear to auscultation bilaterally Breasts: normal appearance, no masses or tenderness Heart: regular rate and rhythm Abdomen: soft, non-tender; no masses,  no organomegaly Extremities: extremities normal, atraumatic, no cyanosis or edema Skin: Skin color, texture, turgor normal. No rashes or lesions Lymph nodes: Cervical, supraclavicular, and axillary nodes normal. No abnormal inguinal nodes palpated Neurologic: Grossly normal   Pelvic: External genitalia:   Lesion with sebaceous cyst without exudate with slight swelling.  Other signs of redness c/w yeast               Urethra:  normal appearing urethra with no masses, tenderness or lesions              Bartholin's and Skene's: normal                  Vagina: normal appearing vagina with normal color and discharge, no lesions              Cervix: anteverted              Pap taken: Yes.   Bimanual Exam:  Uterus:  normal size, contour, position, consistency, mobility, non-tender              Adnexa: no mass,  fullness, tenderness               Rectovaginal: Confirms               Anus:  normal sphincter tone, no lesions  Chaperone present: yes  A:  Well Woman with normal exam  Normal menses and condoms for birth control             Sebaceous cyst left vulva without exudate             R/O STD's, vaginitis -most likely yeast vaginitis             history of hypercholesterolemia, colon polyps  S/P Laparoscopic gastric banding 12/2011 and removal 10/13 along with lap chole  History of MS  Will start on POP for contraception   P:   Reviewed health and wellness pertinent to exam  Pap smear is done  Mammogram is due 05/2016  Will call with lab results and pap  Will start her on Septra BID for the labial cyst - if symptoms gets worse or needs I&D she is to call back -doubtful this will need to be done  Will start on Micronor for contraception - prior history of superficial phlebitis on OCP yrs ago  She is given potential side effects and risk - to continue with condoms.  She plans to have him get checked for all STD's as well.  Counseled on breast self exam, mammography screening, use and side effects of POP's, adequate intake of calcium and vitamin D, diet and exercise return annually or prn  An After Visit Summary was printed and given to the patient.

## 2016-06-17 LAB — HSV(HERPES SIMPLEX VRS) I + II AB-IGG
HSV 1 Glycoprotein G Ab, IgG: 4.08 Index — ABNORMAL HIGH (ref <0.90)
HSV 2 Glycoprotein G Ab, IgG: <0.9 Index (ref <0.90)

## 2016-06-17 LAB — WET PREP BY MOLECULAR PROBE
CANDIDA SPECIES: NEGATIVE
GARDNERELLA VAGINALIS: NEGATIVE
TRICHOMONAS VAG: NEGATIVE

## 2016-06-17 LAB — STD PANEL
HIV: NONREACTIVE
Hepatitis B Surface Ag: NEGATIVE

## 2016-06-17 LAB — HEPATITIS C ANTIBODY: HCV AB: NEGATIVE

## 2016-06-18 LAB — IPS PAP TEST WITH HPV

## 2016-06-18 LAB — IPS N GONORRHOEA AND CHLAMYDIA BY PCR

## 2016-06-19 NOTE — Progress Notes (Signed)
Encounter reviewed by Dr. Brook Amundson C. Silva.  

## 2016-06-20 LAB — HSV 1/2 AB (IGM), IFA W/RFLX TITER
HSV 1 IGM SCREEN: NEGATIVE
HSV 2 IgM Screen: NEGATIVE

## 2016-06-22 ENCOUNTER — Encounter: Payer: Self-pay | Admitting: Nurse Practitioner

## 2016-06-24 ENCOUNTER — Other Ambulatory Visit: Payer: Self-pay | Admitting: Nurse Practitioner

## 2016-06-24 MED ORDER — METRONIDAZOLE 0.75 % VA GEL: 1.0000 | Freq: Every day | VAGINAL | 0 refills | Status: DC

## 2016-06-24 MED ORDER — FLUCONAZOLE 150 MG PO TABS
150.0000 mg | ORAL_TABLET | Freq: Once | ORAL | 0 refills | Status: AC
Start: 2016-06-24 — End: 2016-06-24

## 2016-06-24 NOTE — Progress Notes (Signed)
See note on my chart about + BV and yeast found on Pap but not on wet prep.

## 2016-07-29 ENCOUNTER — Telehealth: Payer: Self-pay | Admitting: Nurse Practitioner

## 2016-07-29 DIAGNOSIS — R87619 Unspecified abnormal cytological findings in specimens from cervix uteri: Secondary | ICD-10-CM

## 2016-07-29 NOTE — Telephone Encounter (Signed)
As discussed by phone and pt was told that she would need a colpo biopsy for abnormal pap.  She has not made that apt.  [facility]  Discussed with Dr. Edward JollySilva at time of pap and she will be happy to do this. Put in 08 recall.l

## 2016-07-30 ENCOUNTER — Encounter: Payer: Self-pay | Admitting: Nurse Practitioner

## 2016-07-30 NOTE — Telephone Encounter (Signed)
Spoke with patient regarding benefit for recommended colposcopy. Patient understood and agreeable. Patient ready to schedule. Patient scheduled 08/12/16 with Dr Edward Jolly. Patient aware of date, arrival time and cancellation policy. No further questions. Ok to close  Routing to [employer]  cc: [employer]

## 2016-07-30 NOTE — Telephone Encounter (Signed)
Called pt again and left a message that Samantha Tyler would be doing pre certification for colpo biopsy and she would be getting a call to get scheduled.Marland Kitchen

## 2016-07-30 NOTE — Telephone Encounter (Signed)
Left message to call Semaja Lymon at [phone].  Need to provide patient with colpo instructions. Has patient started Micronor? LMP was 06/06/2016 per records.

## 2016-07-31 NOTE — Telephone Encounter (Signed)
Return call to Kaitlyn. °

## 2016-07-31 NOTE — Telephone Encounter (Signed)
Spoke with patient. Patient states that she started her menses today 07/31/2016. Not currently on any form of birth control. Colposcopy appointment moved to 08/10/2016 at 12:30 pm with Dr.Silva so that this can be completed before cycle day 12. Patient is agreeable to date and time.  Instructions given. Motrin 800 mg po x , one hour before appointment with food. Make sure to eat a meal before appointment and drink plenty of fluids. Patient verbalized understanding and will call to reschedule if will be on menses or has any concerns regarding pregnancy.Patient agreeable and verbalized understanding of all instructions.   Cc: Ria Comment, FNP   Routing to provider for final review. Patient agreeable to disposition. Will close encounter.

## 2016-08-10 ENCOUNTER — Ambulatory Visit (INDEPENDENT_AMBULATORY_CARE_PROVIDER_SITE_OTHER): Payer: 59 | Admitting: Obstetrics and Gynecology

## 2016-08-10 ENCOUNTER — Encounter: Payer: Self-pay | Admitting: Obstetrics and Gynecology

## 2016-08-10 VITALS — BP 102/66 | HR 72 | Resp 16 | Ht 69.0 in | Wt 174.0 lb

## 2016-08-10 DIAGNOSIS — R87619 Unspecified abnormal cytological findings in specimens from cervix uteri: Secondary | ICD-10-CM | POA: Diagnosis not present

## 2016-08-10 DIAGNOSIS — R87610 Atypical squamous cells of undetermined significance on cytologic smear of cervix (ASC-US): Secondary | ICD-10-CM

## 2016-08-10 DIAGNOSIS — R87810 Cervical high risk human papillomavirus (HPV) DNA test positive: Secondary | ICD-10-CM

## 2016-08-10 DIAGNOSIS — R87618 Other abnormal cytological findings on specimens from cervix uteri: Secondary | ICD-10-CM

## 2016-08-10 DIAGNOSIS — N870 Mild cervical dysplasia: Secondary | ICD-10-CM

## 2016-08-10 HISTORY — DX: Mild cervical dysplasia: N87.0

## 2016-08-10 NOTE — Progress Notes (Signed)
Subjective:     Patient ID: Samantha Tyler, female   DOB: 1971-08-21, 45 y.o.   MRN: [id number]  HPI Patient here for colposcopy. Pap 06/16/16 - ASCUS with high risk HPV detected BV and endometrial cells also noted.  LMP was 06/06/16.  Took Metrogel for BV and Diflucan, and she completed the treatments.  Menses are about every 28 days.  No intermenstrual menstrual bleeding.  States hx of abnormal pap and biopsy 15 years ago.  No treatment.   Review of Systems LMP:07/31/16 Contraception: None UPT: Negative    Objective:   Physical Exam  Genitourinary:     Colposcopy.   Consent for procedure.  Speculum placed.  3% acetic acid.  Colposcopy satisfactory.  White light and green filter used.  Acetowhite mosaics around almost entire perimeter of cervix except at 6:00 position.  ECC sent to path.  Biopsy at 12:00 exocervix sent to path.  Biopsy of scattered acetowhite change of anterior vaginal wall to path.  Monsel's placed on cervix.  Good hemostasis.  Minimal EBL. No complications.     Assessment:     ASCUS, positive HR HPV.  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 Endometrial cells on pap within 10 days of menses.    Plan:     Follow up biopsy results.  Instructions/precautions given.  Discussed LEEP for mod/high grade lesions. Anticipate cotesting in one year at a minimum.  Discussed implications of endometrial cells on pap smear.   No further work up of this is needed based on her LMP.  _15_____ minutes face to face time of which over 50% was spent in counseling.   After visit summary to patient.

## 2016-08-10 NOTE — Patient Instructions (Signed)

## 2016-08-12 ENCOUNTER — Ambulatory Visit: Payer: 59 | Admitting: Obstetrics and Gynecology

## 2016-08-13 LAB — IPS OTHER TISSUE BIOPSY

## 2016-08-14 ENCOUNTER — Telehealth: Payer: Self-pay

## 2016-08-14 NOTE — Telephone Encounter (Signed)
Spoke with patient. Advised of message and results as seen below from Dr.Silva. Patient is agreeable and verbalizes understanding. Colposcopy and cotesting scheduled for 08/11/2017 at 10 am with Dr.Silva. Patient verbalized understanding and will call to reschedule if will be on menses or has any concerns regarding pregnancy. 08 recall placed.   Routing to provider for final review. Patient agreeable to disposition. Will close encounter.

## 2016-08-14 NOTE — Telephone Encounter (Signed)
-----   Message from Patton Salles, MD sent at 08/14/2016  5:11 AM EST ----- Please share results of colposcopy showing LGSIL of cervix and the vagina.  No cancer was seen.  No treatment is indicated.  Patient will need a colposcopy and cotesting (pap and HR HPV) in one year.  Please schedule her appointment with me.  Needs recall - 08.  Cc- Claudette Laws

## 2016-08-14 NOTE — Telephone Encounter (Signed)
Left message to call Kaitlyn at [phone]. 

## 2016-08-18 ENCOUNTER — Encounter: Payer: Self-pay | Admitting: Obstetrics and Gynecology

## 2016-08-24 DIAGNOSIS — B009 Herpesviral infection, unspecified: Secondary | ICD-10-CM

## 2016-08-24 HISTORY — DX: Herpesviral infection, unspecified: B00.9

## 2017-03-15 ENCOUNTER — Telehealth: Payer: Self-pay | Admitting: Obstetrics and Gynecology

## 2017-03-15 NOTE — Telephone Encounter (Signed)
Left message for pt to reschedule Patty Grubb appt. °

## 2017-06-16 ENCOUNTER — Ambulatory Visit: Payer: 59 | Admitting: Nurse Practitioner

## 2017-06-25 ENCOUNTER — Ambulatory Visit: Payer: 59 | Admitting: Obstetrics and Gynecology

## 2017-06-29 ENCOUNTER — Ambulatory Visit: Payer: 59 | Admitting: Obstetrics and Gynecology

## 2017-06-29 ENCOUNTER — Other Ambulatory Visit (HOSPITAL_COMMUNITY)
Admission: RE | Admit: 2017-06-29 | Discharge: 2017-06-29 | Disposition: A | Discharge status: Home / Self Care | Payer: 59 | Source: Ambulatory Visit | Attending: Obstetrics and Gynecology | Admitting: Obstetrics and Gynecology

## 2017-06-29 ENCOUNTER — Encounter: Payer: Self-pay | Admitting: Obstetrics and Gynecology

## 2017-06-29 VITALS — BP 102/64 | HR 88 | Resp 16 | Ht 69.0 in | Wt 172.0 lb

## 2017-06-29 DIAGNOSIS — Z23 Encounter for immunization: Secondary | ICD-10-CM | POA: Diagnosis not present

## 2017-06-29 DIAGNOSIS — Z01419 Encounter for gynecological examination (general) (routine) without abnormal findings: Secondary | ICD-10-CM | POA: Diagnosis not present

## 2017-06-29 MED ORDER — NORETHINDRONE 0.35 MG PO TABS: 1.0000 | ORAL_TABLET | Freq: Every day | ORAL | 3 refills | Status: DC

## 2017-06-29 NOTE — Patient Instructions (Signed)
EXERCISE AND DIET:  We recommended that you start or continue a regular exercise program for good health. Regular exercise means any activity that makes your heart beat faster and makes you sweat.  We recommend exercising at least 30 minutes per day at least 3 days a week, preferably 4 or 5.  We also recommend a diet low in fat and sugar.  Inactivity, poor dietary choices and obesity can cause diabetes, heart attack, stroke, and kidney damage, among others.    ALCOHOL AND SMOKING:  Women should limit their alcohol intake to no more than 7 drinks/beers/glasses of wine (combined, not each!) per week. Moderation of alcohol intake to this level decreases your risk of breast cancer and liver damage. And of course, no recreational drugs are part of a healthy lifestyle.  And absolutely no smoking or even second hand smoke. Most people know smoking can cause heart and lung diseases, but did you know it also contributes to weakening of your bones? Aging of your skin?  Yellowing of your teeth and nails?  CALCIUM AND VITAMIN D:  Adequate intake of calcium and Vitamin D are recommended.  The recommendations for exact amounts of these supplements seem to change often, but generally speaking 600 mg of calcium (either carbonate or citrate) and 800 units of Vitamin D per day seems prudent. Certain women may benefit from higher intake of Vitamin D.  If you are among these women, your doctor will have told you during your visit.    PAP SMEARS:  Pap smears, to check for cervical cancer or precancers,  have traditionally been done yearly, although recent scientific advances have shown that most women can have pap smears less often.  However, every woman still should have a physical exam from her gynecologist every year. It will include a breast check, inspection of the vulva and vagina to check for abnormal growths or skin changes, a visual exam of the cervix, and then an exam to evaluate the size and shape of the uterus and  ovaries.  And after 44 years of age, a rectal exam is indicated to check for rectal cancers. We will also provide age appropriate advice regarding health maintenance, like when you should have certain vaccines, screening for sexually transmitted diseases, bone density testing, colonoscopy, mammograms, etc.   MAMMOGRAMS:  All women over 44 years old should have a yearly mammogram. Many facilities now offer a "3D" mammogram, which may cost around $50 extra out of pocket. If possible,  we recommend you accept the option to have the 3D mammogram performed.  It both reduces the number of women who will be called back for extra views which then turn out to be normal, and it is better than the routine mammogram at detecting truly abnormal areas.    COLONOSCOPY:  Colonoscopy to screen for colon cancer is recommended for all women at age 44.  We know, you hate the idea of the prep.  We agree, BUT, having colon cancer and not knowing it is worse!!  Colon cancer so often starts as a polyp that can be seen and removed at colonscopy, which can quite literally save your life!  And if your first colonoscopy is normal and you have no family history of colon cancer, most women don't have to have it again for 10 years.  Once every ten years, you can do something that may end up saving your life, right?  We will be happy to help you get it scheduled when you are ready.  Be sure to check your insurance coverage so you understand how much it will cost.  It may be covered as a preventative service at no cost, but you should check your particular policy.     Human Papillomavirus Quadrivalent Vaccine suspension for injection What is this medicine? HUMAN PAPILLOMAVIRUS VACCINE (HYOO muhn pap uh LOH muh vahy ruhs vak SEEN) is a vaccine. It is used to prevent infections of four types of the human papillomavirus. In women, the vaccine may lower your risk of getting cervical, vaginal, vulvar, or anal cancer and genital warts. In men,  the vaccine may lower your risk of getting genital warts and anal cancer. You cannot get these diseases from the vaccine. This vaccine does not treat these diseases. This medicine may be used for other purposes; ask your health care provider or pharmacist if you have questions. COMMON BRAND NAME(S): Gardasil What should I tell my health care provider before I take this medicine? They need to know if you have any of these conditions: -fever or infection -hemophilia -HIV infection or AIDS -immune system problems -low platelet count -an unusual reaction to Human Papillomavirus Vaccine, yeast, other medicines, foods, dyes, or preservatives -pregnant or trying to get pregnant -breast-feeding How should I use this medicine? This vaccine is for injection in a muscle on your upper arm or thigh. It is given by a health care professional. Samantha Tyler will be observed for 15 minutes after each dose. Sometimes, fainting happens after the vaccine is given. You may be asked to sit or lie down during the 15 minutes. Three doses are given. The second dose is given 2 months after the first dose. The last dose is given 4 months after the second dose. A copy of a Vaccine Information Statement will be given before each vaccination. Read this sheet carefully each time. The sheet may change frequently. Talk to your pediatrician regarding the use of this medicine in children. While this drug may be prescribed for children as young as 17 years of age for selected conditions, precautions do apply. Overdosage: If you think you have taken too much of this medicine contact a poison control center or emergency room at once. NOTE: This medicine is only for you. Do not share this medicine with others. What if I miss a dose? All 3 doses of the vaccine should be given within 6 months. Remember to keep appointments for follow-up doses. Your health care provider will tell you when to return for the next vaccine. Ask your health care  professional for advice if you are unable to keep an appointment or miss a scheduled dose. What may interact with this medicine? -other vaccines This list may not describe all possible interactions. Give your health care provider a list of all the medicines, herbs, non-prescription drugs, or dietary supplements you use. Also tell them if you smoke, drink alcohol, or use illegal drugs. Some items may interact with your medicine. What should I watch for while using this medicine? This vaccine may not fully protect everyone. Continue to have regular pelvic exams and cervical or anal cancer screenings as directed by your doctor. The Human Papillomavirus is a sexually transmitted disease. It can be passed by any kind of sexual activity that involves genital contact. The vaccine works best when given before you have any contact with the virus. Many people who have the virus do not have any signs or symptoms. Tell your doctor or health care professional if you have any reaction or unusual symptom after  symptom after getting the vaccine. What side effects may I notice from receiving this medicine? Side effects that you should report to your doctor or health care professional as soon as possible: -allergic reactions like skin rash, itching or hives, swelling of the face, lips, or tongue -breathing problems -feeling faint or lightheaded, falls Side effects that usually do not require medical attention (report to your doctor or health care professional if they continue or are bothersome): -cough -dizziness -fever -headache -nausea -redness, warmth, swelling, pain, or itching at site where injected This list may not describe all possible side effects. Call your doctor for medical advice about side effects. You may report side effects to FDA at 1-800-FDA-1088. Where should I keep my medicine? This drug is given in a hospital or clinic and will not be stored at home. NOTE: This sheet is a summary. It may not cover all  possible information. If you have questions about this medicine, talk to your doctor, pharmacist, or health care provider.  2018 Elsevier/Gold Standard (2013-10-02 13:14:33)  

## 2017-06-29 NOTE — Progress Notes (Signed)
46 y.o. G0P0000 Single Caucasian female here for annual exam.    Off Micronor as she thought she is not at risk for pregnancy due to prior medication for MS. Having menses.   Sees Dr. Tinnie GensJeffrey in [place] for her [place].  On Gilenya. Positive for JC virus.   ROS - HA, constipation, urinary frequency and urgency, bruising, excessive hair growth.   Does electrolysis and laser for hair growth.  Just plucks.  Is a pharmacist at [employer].  PCP: Synetta Failaniel Jobe, MD    Patient's last menstrual period was 06/17/2017.           Sexually active: Yes.    The current method of family planning is none.    Exercising: No.  The patient does not participate in regular exercise at present. Smoker:  no  Health Maintenance: Pap: 08/10/16 colposcopy showing LGSIL of cervix and the vagina  06/16/16 ASCUS w/ +HR HPV  11/03/12, Negative with neg HR HPV History of abnormal Pap:  yes MMG:  07/06/16 BIRADS 1 negative/density c.  Appt made with PCP.  Colonoscopy:  06/17/15, tubular adenoma, repeat in 5 years (Pathology in Care Everywhere) BMD:   2013  TDaP:  2007 Gardasil:   n/a HIV and Hep C: 06/16/16 Negative Screening Labs: PCP    reports that  has never smoked. she has never used smokeless tobacco. She reports that she does not drink alcohol or use drugs.  Past Medical History:  Diagnosis Date  . 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   . Colon polyps   . Contact lens/glasses fitting   . Depression   . Fatigue   . Gallbladder & bile duct stone, nonacute cholecystitis   . H/O renal calculi 7/10  & 5/12   X 2  . Hernia   . History of colon polyps   . History of colon polyps   . Hyperlipidemia   . Multiple sclerosis (HCC) 12-23-11   dx.12'09-Recent tx. Tysabri(last IV tx. 09-21-11), only Ritalin now  . Neuromuscular disorder (HCC)    MS   . Peripheral vascular disease (HCC)    varicose veins, hx of superficial thrombus in leg.    Past  Surgical History:  Procedure Laterality Date  . banding removed    . COLONOSCOPY    . HERNIA REPAIR  02/1995   inguinal    Current Outpatient Medications  Medication Sig Dispense Refill  . amphetamine-dextroamphetamine (ADDERALL) 20 MG tablet Take 20 mg 2 (two) times daily by mouth.  0  . calcium carbonate (CALCIUM 500) 1250 MG tablet Take 1 tablet by mouth 3 (three) times daily.     . cyclobenzaprine (FLEXERIL) 10 MG tablet Take 10 mg as needed by mouth.    . DiphenhydrAMINE HCl (BENADRYL PO) Take by mouth as needed.    . DULoxetine (CYMBALTA) 60 MG capsule Take 1 capsule by mouth daily.    Marland Kitchen. FLUoxetine (PROZAC) 20 MG capsule Take 1 capsule by mouth daily.    Marland Kitchen. gabapentin (NEURONTIN) 300 MG capsule Take 300 mg by mouth.    Marland Kitchen. GILENYA 0.5 MG CAPS Take 1 tablet by mouth daily.    Marland Kitchen. HYDROcodone-acetaminophen (NORCO/VICODIN) 5-325 MG per tablet Take by mouth as needed.    . Multiple Vitamin (MULTIVITAMIN WITH MINERALS) TABS Take 1 tablet by mouth daily.    . rosuvastatin (CRESTOR) 5 MG tablet TAKE 1 TABLET (5 MG TOTAL) BY MOUTH NIGHTLY.  11  . topiramate (TOPAMAX) 100  MG tablet Take 1 tablet by mouth 2 (two) times daily.    . TOVIAZ 8 MG TB24 tablet Take 1 tablet by mouth daily.    . norethindrone (MICRONOR,CAMILA,ERRIN) 0.35 MG tablet Take 1 tablet (0.35 mg total) daily by mouth. 3 Package 3   No current facility-administered medications for this visit.     Family History  Problem Relation Age of Onset  . Hyperlipidemia Mother   . Hypertension Mother   . Hypothyroidism Mother   . Cancer Mother        pituitary  . Hyperlipidemia Father   . Hypertension Father   . Heart disease Father   . Heart disease Brother 50       stent placement    ROS:  Pertinent items are noted in HPI.  Otherwise, a comprehensive ROS was negative.  Exam:   BP 102/64 (BP Location: Right Arm, Patient Position: Sitting, Cuff Size: Normal)   Pulse 88   Resp 16   Ht 5\' 9"  (1.753 m)   Wt 172 lb (78 kg)    LMP 06/17/2017   BMI 25.40 kg/m     General appearance: alert, cooperative and appears stated age Head: Normocephalic, without obvious abnormality, atraumatic Neck: no adenopathy, supple, symmetrical, trachea midline and thyroid normal to inspection and palpation Lungs: clear to auscultation bilaterally Breasts: normal appearance, no masses or tenderness, No nipple retraction or dimpling, No nipple discharge or bleeding, No axillary or supraclavicular adenopathy Heart: regular rate and rhythm Abdomen: soft, non-tender; no masses, no organomegaly Extremities: extremities normal, atraumatic, no cyanosis or edema Skin: Skin color, texture, turgor normal. No rashes or lesions Lymph nodes: Cervical, supraclavicular, and axillary nodes normal. No abnormal inguinal nodes palpated Neurologic: Grossly normal  Pelvic: External genitalia:  no lesions              Urethra:  normal appearing urethra with no masses, tenderness or lesions              Bartholins and Skenes: normal                 Vagina: normal appearing vagina with normal color and discharge, no lesions              Cervix: no lesions.  Bleeds with pap.               Pap taken: Yes.   Bimanual Exam:  Uterus:  normal size, contour, position, consistency, mobility, non-tender              Adnexa: no mass, fullness, tenderness              Rectal exam: Yes.  .  Confirms.              Anus:  normal sphincter tone, no lesions  Chaperone was present for exam.  Assessment:   Well woman visit with normal exam. MS.  On Gilenya.  Positive for JC Virus.  Hx LGSIL.   Plan: Mammogram screening discussed. Recommended self breast awareness. Pap and HR HPV as above. Guidelines for Calcium, Vitamin D, regular exercise program including cardiovascular and weight bearing exercise. Needs pap and HR HPV yearly.  Will start Gardasil vaccine. She can do the first 2 through this office.   TDap. Restart Micronor, 3 packs and 3 refills.  Follow  up annually and prn.   After visit summary provided.

## 2017-07-02 LAB — CYTOLOGY - PAP
Diagnosis: NEGATIVE
HPV: NOT DETECTED

## 2017-07-12 ENCOUNTER — Telehealth: Payer: Self-pay

## 2017-07-12 NOTE — Telephone Encounter (Signed)
Called patient to discuss message from Dr.Silva, LMOVM to call me.

## 2017-07-12 NOTE — Telephone Encounter (Signed)
-----   Message from Patton SallesBrook E Amundson C Silva, MD sent at 07/09/2017 12:57 PM EST ----- I did review her colposcopy from last year.  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 We have two options:  One is to do a pap of the anterior vaginal wall and the other is to do a colposcopy.   Does she want to start with the pap of the anterior vaginal wall instead?  Thank you,   Brook  ----- Message ----- From: Alphonsa Overallixon, Donnelle Olmeda L, CMA Sent: 07/05/2017   1:44 PM To: Patton SallesBrook E Amundson C Silva, MD  Dr.Silva, okay to cancel colposcopy which is scheduled 08-10-18? This was scheduled last year after her colposcopy.

## 2017-07-18 ENCOUNTER — Telehealth: Payer: 59 | Admitting: Family

## 2017-07-18 DIAGNOSIS — A6000 Herpesviral infection of urogenital system, unspecified: Secondary | ICD-10-CM

## 2017-07-18 NOTE — Progress Notes (Signed)
Based on what you shared with me it looks like you have a serious condition that should be evaluated in a face to face office visit.  NOTE: Even if you have entered your credit card information for this eVisit, you will not be charged.   If you are having a true medical emergency please call 911.  If you need an urgent face to face visit, [facility] has four urgent care centers for your convenience.  If you need care fast and have a high deductible or no insurance consider:   [email or web address]  [phone]  [address], [address] [place], [place] [address] 8 am to 8 pm Monday-Friday 10 am to 4 pm Saturday-Sunday   The following sites will take your  insurance:    . [facility]  [phone] Get Driving Directions Find a Provider at this Location  [address] [place], [place] [address] . 10 am to 8 pm Monday-Friday . 12 pm to 8 pm Saturday-Sunday   . [facility]  [phone] Get Driving Directions Find a Provider at this Location  [address], [address] , [place] [address] . 8 am to 8 pm Monday-Friday . 9 am to 6 pm Saturday . 11 am to 6 pm Sunday   . [facility] [place]  [phone] Get Driving Directions  [address] [place], [place] [address] . 8 am to 8 pm Monday-Friday . 8 am to 4 pm Saturday-Sunday   Your e-visit answers were reviewed by a board certified advanced clinical practitioner to complete your personal care plan.  Thank you for using e-Visits.  

## 2017-07-19 ENCOUNTER — Other Ambulatory Visit: Payer: Self-pay

## 2017-07-19 ENCOUNTER — Telehealth: Payer: Self-pay | Admitting: Obstetrics and Gynecology

## 2017-07-19 ENCOUNTER — Encounter: Payer: Self-pay | Admitting: Obstetrics and Gynecology

## 2017-07-19 ENCOUNTER — Other Ambulatory Visit (HOSPITAL_COMMUNITY)
Admission: RE | Admit: 2017-07-19 | Discharge: 2017-07-19 | Disposition: A | Discharge status: Home / Self Care | Payer: 59 | Source: Ambulatory Visit | Attending: Obstetrics and Gynecology | Admitting: Obstetrics and Gynecology

## 2017-07-19 ENCOUNTER — Ambulatory Visit: Payer: 59 | Admitting: Obstetrics and Gynecology

## 2017-07-19 VITALS — BP 120/70 | HR 90 | Resp 16 | Wt 171.0 lb

## 2017-07-19 DIAGNOSIS — N890 Mild vaginal dysplasia: Secondary | ICD-10-CM | POA: Insufficient documentation

## 2017-07-19 DIAGNOSIS — Z113 Encounter for screening for infections with a predominantly sexual mode of transmission: Secondary | ICD-10-CM

## 2017-07-19 DIAGNOSIS — N766 Ulceration of vulva: Secondary | ICD-10-CM

## 2017-07-19 MED ORDER — VALACYCLOVIR HCL 1 G PO TABS: 1000.0000 mg | ORAL_TABLET | Freq: Two times a day (BID) | ORAL | 1 refills | Status: DC

## 2017-07-19 NOTE — Telephone Encounter (Signed)
Spoke with patient. 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 Advised will need to be seen for further evaluation. Patient is agreeable. Appointment scheduled for today 07/19/2017 at 12 pm with Dr.Silva. Patient is agreeable to date and time.  Routing to provider for final review. Patient agreeable to disposition. Will close encounter.

## 2017-07-19 NOTE — Progress Notes (Signed)
GYNECOLOGY  VISIT   HPI: 46 y.o.   Single  Caucasian  female   G0P0000 with Patient's last menstrual period was 07/05/2017.   here for possible HSV outbreak.   Has painful sores on the perineum since Thanksgiving last week.  No muscle aches, flu like symptoms.   No prior HSV outbreak.   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  No new partner.   GYNECOLOGIC HISTORY: Patient's last menstrual period was 07/05/2017. Contraception:  OCP Menopausal hormone therapy:  n/a Last mammogram:  07/03/16 BIRADS 1 negative, density C  Last pap smear:   06/29/17 negative, HR HPV negative         OB History    Gravida Para Term Preterm AB Living   0 0 0 0 0 0   SAB TAB Ectopic Multiple Live Births   0 0 0 0 0         Patient Active Problem List   Diagnosis Date Noted  . OAB (overactive bladder) 09/13/2013  . Dysphagia 04/15/2012  . Lapband APS with hiatus hernia repair May 2013 01/21/2012  . MS (multiple sclerosis) (HCC) 08/20/2011  . Obesity, Class III, BMI 40-49.9 (morbid obesity) (HCC) 08/20/2011  . HYPERLIPIDEMIA 05/22/2008  . HYPERSOMNIA, PERSISTENT 05/22/2008  . ALLERGIC RHINITIS 05/22/2008    Past Medical History:  Diagnosis Date  . 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   . Colon polyps   . Contact lens/glasses fitting   . Depression   . Fatigue   . Gallbladder & bile duct stone, nonacute cholecystitis   . H/O renal calculi 7/10  & 5/12   X 2  . Hernia   . History of colon polyps   . History of colon polyps   . Hyperlipidemia   . Multiple sclerosis (HCC) 12-23-11   dx.12'09-Recent tx. Tysabri(last IV tx. 09-21-11), only Ritalin now  . Neuromuscular disorder (HCC)    MS   . Peripheral vascular disease (HCC)    varicose veins, hx of superficial thrombus in leg.    Past Surgical History:  Procedure Laterality Date  . banding removed    . BREATH TEK H PYLORI  10/23/2011   Procedure:  BREATH TEK H PYLORI;  Surgeon: Valarie Merino, MD;  Location: [facility];  Service: General;  Laterality: N/A;  . CHOLECYSTECTOMY  06/14/2012   Procedure: LAPAROSCOPIC CHOLECYSTECTOMY WITH INTRAOPERATIVE CHOLANGIOGRAM;  Surgeon: Valarie Merino, MD;  Location: [facility];  Service: General;  Laterality: N/A;  . COLONOSCOPY    . ESOPHAGOGASTRODUODENOSCOPY  05/12/2012   Procedure: ESOPHAGOGASTRODUODENOSCOPY (EGD);  Surgeon: Kandis Cocking, MD;  Location: [facility];  Service: General;  Laterality: N/A;  . HERNIA REPAIR  02/1995   inguinal  . LAPAROSCOPIC GASTRIC BANDING  12/29/2011   Procedure: LAPAROSCOPIC GASTRIC BANDING;  Surgeon: Valarie Merino, MD;  Location: [facility];  Service: General;  Laterality: N/A;    Current Outpatient Medications  Medication Sig Dispense Refill  . amphetamine-dextroamphetamine (ADDERALL) 20 MG tablet Take 20 mg 2 (two) times daily by mouth.  0  . calcium carbonate (CALCIUM 500) 1250 MG tablet Take 1 tablet by mouth 3 (three) times daily.     . cyclobenzaprine (FLEXERIL) 10 MG tablet Take 10 mg as needed by mouth.    . DiphenhydrAMINE HCl (BENADRYL PO) Take by mouth as needed.    . DULoxetine (CYMBALTA) 60 MG capsule Take 1 capsule by  mouth daily.    Marland Kitchen. FLUoxetine (PROZAC) 20 MG capsule Take 1 capsule by mouth daily.    Marland Kitchen. gabapentin (NEURONTIN) 300 MG capsule Take 300 mg by mouth.    Marland Kitchen. GILENYA 0.5 MG CAPS Take 1 tablet by mouth daily.    Marland Kitchen. HYDROcodone-acetaminophen (NORCO/VICODIN) 5-325 MG per tablet Take by mouth as needed.    . Multiple Vitamin (MULTIVITAMIN WITH MINERALS) TABS Take 1 tablet by mouth daily.    . norethindrone (MICRONOR,CAMILA,ERRIN) 0.35 MG tablet Take 1 tablet (0.35 mg total) daily by mouth. 3 Package 3  . rosuvastatin (CRESTOR) 5 MG tablet TAKE 1 TABLET (5 MG TOTAL) BY MOUTH NIGHTLY.  11  . topiramate (TOPAMAX) 100 MG tablet Take 1 tablet by mouth 2 (two) times daily.    . TOVIAZ 8 MG TB24 tablet Take 1 tablet by mouth daily.     No current  facility-administered medications for this visit.      ALLERGIES: Tape  Family History  Problem Relation Age of Onset  . Hyperlipidemia Mother   . Hypertension Mother   . Hypothyroidism Mother   . Cancer Mother        pituitary  . Hyperlipidemia Father   . Hypertension Father   . Heart disease Father   . Heart disease Brother 50       stent placement    Social History   Socioeconomic History  . Marital status: Single    Spouse name: Not on file  . Number of children: Not on file  . Years of education: Not on file  . Highest education level: Not on file  Social Needs  . Financial resource strain: Not on file  . Food insecurity - worry: Not on file  . Food insecurity - inability: Not on file  . Transportation needs - medical: Not on file  . Transportation needs - non-medical: Not on file  Occupational History  . Not on file  Tobacco Use  . Smoking status: Never Smoker  . Smokeless tobacco: Never Used  Substance and Sexual Activity  . Alcohol use: No    Comment: rare social  . Drug use: No  . Sexual activity: Yes    Birth control/protection: Pill  Other Topics Concern  . Not on file  Social History Narrative  . Not on file    ROS:  Pertinent items are noted in HPI.  PHYSICAL EXAMINATION:    BP 120/70 (BP Location: Right Arm, Patient Position: Sitting, Cuff Size: Normal)   Pulse 90   Resp 16   Wt 171 lb (77.6 kg)   LMP 07/05/2017   BMI 25.25 kg/m     General appearance: alert, cooperative and appears stated age Inguinal nodes:  Normal size and nontender.   Pelvic: External genitalia:   Perineum with multiple small 3 - 4 mm shallow, painful ulcers.              Urethra:  normal appearing urethra with no masses, tenderness or lesions              Bartholins and Skenes: normal                 Vagina: normal appearing vagina with normal color and discharge, no lesions.  Pap taken of anterior vaginal wall.              Cervix: no lesions                 Bimanual Exam:  Uterus:  normal  size, contour, position, consistency, mobility, non-tender              Adnexa: no mass, fullness, tenderness                Chaperone was present for exam.  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  PLAN  We discussed HSV I and II, transmission, asymptomatic shedding, condom use for prevention of transmission, and antiviral treatment prn and as prophylaxis. Will start Valtrex and treat as initial outbreak.  HSV culture taken. She declines written information about HSV. Will do full STD testing including serum HSV testing.  Pap done today of the anterior vaginal wall as this was not done at her recent annual exam.  I did review the Gardasil vaccine series that has been initiated for the patient.  She is aware that this may not be covered by her insurance.   An After Visit Summary was printed and given to the patient.  _25____ minutes face to face time of which over 50% was spent in counseling.

## 2017-07-19 NOTE — Telephone Encounter (Signed)
-----   Message from Mychart, Generic sent at 07/18/2017 4:09 PM EST -----    Appointment Request From: Damaris HippoMary E Mclennan    With Provider: Melony OverlyBrook A Silva, MD Ginette Otto[[facility]]    Preferred Date Range: 07/20/2017 - 07/20/2017    Preferred Times: Monday Morning, Monday Afternoon    Reason for visit: Request an Appointment    Comments:  Think I am having initial herpes outbreak wondering if any available appts on Monday.

## 2017-07-20 LAB — CHLAMYDIA/GONOCOCCUS/TRICHOMONAS, NAA
CHLAMYDIA BY NAA: NEGATIVE
Gonococcus by NAA: NEGATIVE
TRICH VAG BY NAA: NEGATIVE

## 2017-07-20 LAB — HEPATITIS C ANTIBODY

## 2017-07-20 LAB — HSV(HERPES SMPLX)ABS-I+II(IGG+IGM)-BLD
HSV 1 Glycoprotein G Ab, IgG: 3.93 index — ABNORMAL HIGH (ref 0.00–0.90)
HSV 2 IgG, Type Spec: <0.91 index (ref 0.00–0.90)
HSVI/II Comb IgM: <0.91 Ratio (ref 0.00–0.90)

## 2017-07-20 LAB — HEP, RPR, HIV PANEL
HIV Screen 4th Generation wRfx: NONREACTIVE
Hepatitis B Surface Ag: NEGATIVE
RPR Ser Ql: NONREACTIVE

## 2017-07-21 LAB — CYTOLOGY - PAP: Diagnosis: NEGATIVE

## 2017-07-21 LAB — HERPES SIMPLEX VIRUS CULTURE

## 2017-07-22 NOTE — Telephone Encounter (Signed)
Patient seen in office 07-19-17 and vaginal pap was done at that time.

## 2017-07-25 ENCOUNTER — Encounter: Payer: Self-pay | Admitting: Obstetrics and Gynecology

## 2017-07-26 ENCOUNTER — Encounter: Payer: Self-pay | Admitting: Obstetrics and Gynecology

## 2017-07-30 ENCOUNTER — Telehealth: Payer: Self-pay

## 2017-07-30 NOTE — Telephone Encounter (Signed)
Called patient to discuss results, lmovm to call me back.

## 2017-07-30 NOTE — Telephone Encounter (Signed)
-----   Message from Patton Salles, MD sent at 07/21/2017  7:46 AM EST ----- Results to patient through My Chart.  Hello Samantha Tyler,   I am sharing results which are positive for herpes type I in the blood stream.  The herpes culture is pending, but so far it looks like this may be herpes type I on your bottom.   Testing is negative for gonorrhea, chlamydia, trichomonas, hepatitis B and C, HIV, and syphilis.  The pap of the vagina is not back yet.  Please contact the office for any questions.   Conley Simmonds, MD

## 2017-08-05 NOTE — Telephone Encounter (Signed)
Spoke with patient and she is aware of all lab results--see result note.

## 2017-08-11 ENCOUNTER — Ambulatory Visit: Payer: 59 | Admitting: Obstetrics and Gynecology

## 2017-09-01 ENCOUNTER — Telehealth: Payer: Self-pay | Admitting: Obstetrics and Gynecology

## 2017-09-01 ENCOUNTER — Ambulatory Visit: Payer: 59

## 2017-09-01 NOTE — Telephone Encounter (Signed)
Left message on voicemail to reschedule missed 2nd gardisil appointment.

## 2017-09-18 ENCOUNTER — Other Ambulatory Visit: Payer: Self-pay | Admitting: Obstetrics and Gynecology

## 2017-09-20 NOTE — Telephone Encounter (Signed)
Medication refill request: Valtrex Last AEX:  06/29/17 BS Next AEX: 07/15/18  Last MMG (if hormonal medication request): 07/06/16 BIRADS 1 negative/density c.  Appt made with PCP. Refill authorized: 07/19/17 #30, 1RF. Today, please advise.

## 2017-10-01 ENCOUNTER — Ambulatory Visit (INDEPENDENT_AMBULATORY_CARE_PROVIDER_SITE_OTHER): Payer: 59

## 2017-10-01 VITALS — BP 120/64 | HR 80 | Ht 69.0 in | Wt 164.8 lb

## 2017-10-01 DIAGNOSIS — Z23 Encounter for immunization: Secondary | ICD-10-CM | POA: Diagnosis not present

## 2018-01-03 ENCOUNTER — Other Ambulatory Visit: Payer: Self-pay | Admitting: Obstetrics and Gynecology

## 2018-01-03 NOTE — Telephone Encounter (Signed)
Medication refill request: Valtrex  Last AEX:  06-29-17  Next AEX: 07-15-18  Last MMG (if hormonal medication request): 09-16-11 WNL  Refill authorized: please advise

## 2018-03-08 ENCOUNTER — Other Ambulatory Visit: Payer: Self-pay | Admitting: Obstetrics and Gynecology

## 2018-03-08 NOTE — Telephone Encounter (Signed)
Medication refill request: valtrex  Last AEX:  06/29/17  Next AEX: 07/15/18 Dr. Edward Jolly  Last MMG (if hormonal medication request): 09/30/17 BIRADS1:Neg - Care Everywhere  Refill authorized: please advise.

## 2018-03-29 ENCOUNTER — Other Ambulatory Visit: Payer: Self-pay | Admitting: *Deleted

## 2018-03-29 MED ORDER — VALACYCLOVIR HCL 1 G PO TABS: 1000.0000 mg | ORAL_TABLET | Freq: Every day | ORAL | 0 refills | Status: DC

## 2018-03-29 NOTE — Telephone Encounter (Signed)
Incoming fax request received from [facility] for the following:   Medication refill request: Valtrex Last AEX:  06-29-17 BS Next AEX: 07-15-18  Last MMG (if hormonal medication request): 09-30-17 density C/BIRADS 1 negative  Refill authorized: today, please advise.   Spoke with patient in regards to refill request. Patient states she would like medication to go to mail order pharmacy, [facility], so she can get her medication cheaper. RN advised would send request to Dr. Edward Jolly. Patient agreeable. Medication pended for 3 month supply to [facility] as patient has aex scheduled on 07-15-18.

## 2018-05-08 ENCOUNTER — Other Ambulatory Visit: Payer: Self-pay | Admitting: Obstetrics and Gynecology

## 2018-05-09 NOTE — Telephone Encounter (Signed)
Medication refill request: Valtrex Last AEX:  2018 Next AEX: 06/2018 Last MMG (if hormonal medication request): 2013 ASSESSMENT: Negative - BI-RADS 1 Refill authorized: #90, 0 refills

## 2018-07-08 ENCOUNTER — Telehealth: Payer: Self-pay | Admitting: Obstetrics and Gynecology

## 2018-07-08 NOTE — Telephone Encounter (Signed)
Left message regarding upcoming appointment has been canceled and needs to be rescheduled. °

## 2018-07-15 ENCOUNTER — Ambulatory Visit: Payer: 59 | Admitting: Obstetrics and Gynecology

## 2018-08-01 ENCOUNTER — Telehealth: Payer: Self-pay | Admitting: Obstetrics and Gynecology

## 2018-08-01 ENCOUNTER — Other Ambulatory Visit: Payer: Self-pay | Admitting: Obstetrics and Gynecology

## 2018-08-01 ENCOUNTER — Ambulatory Visit: Payer: 59 | Admitting: Obstetrics and Gynecology

## 2018-08-01 NOTE — Telephone Encounter (Signed)
Thank you for the update!

## 2018-08-01 NOTE — Progress Notes (Deleted)
47 y.o. G0P0000 Single Caucasian female here for annual exam.    PCP:     No LMP recorded.           Sexually active: {yes no:314532}  The current method of family planning is {contraception:315051}.    Exercising: {yes no:314532}  {types:19826} Smoker:  no  Health Maintenance: Pap: 06-29-17 Neg:Neg HR HPV, 06-16-16 ASCUS:Pos HR HPV History of abnormal Pap:  Yes, 06/16/16 ASCUS w/ +HR HPV--08/10/16 colposcopy showing LGSIL of cervix and the vagina MMG:  *** Colonoscopy: 06/17/15, tubular adenoma,repeat in 5 years(Pathology in Care Everywhere) BMD: ***?2013  Result  *** TDaP: 06-29-17 Gardasil:   no HIV: 07-19-17 NR Hep C: 07-19-17 Neg Screening Labs:  Hb today: ***, Urine today: ***   reports that she has never smoked. She has never used smokeless tobacco. She reports that she does not drink alcohol or use drugs.  Past Medical History:  Diagnosis Date  . 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   . Colon polyps   . Contact lens/glasses fitting   . Depression   . Fatigue   . Gallbladder & bile duct stone, nonacute cholecystitis   . H/O renal calculi 7/10  & 5/12   X 2  . Hernia   . History of colon polyps   . History of colon polyps   . HSV-1 infection   . HSV-2 infection 2018  . Hyperlipidemia   . Multiple sclerosis (HCC) 12-23-11   dx.12'09-Recent tx. Tysabri(last IV tx. 09-21-11), only Ritalin now  . Neuromuscular disorder (HCC)    MS   . Peripheral vascular disease (HCC)    varicose veins, hx of superficial thrombus in leg.    Past Surgical History:  Procedure Laterality Date  . banding removed    . BREATH TEK H PYLORI  10/23/2011   Procedure: BREATH TEK H PYLORI;  Surgeon: Valarie Merino, MD;  Location: [facility];  Service: General;  Laterality: N/A;  . CHOLECYSTECTOMY  06/14/2012   Procedure: LAPAROSCOPIC CHOLECYSTECTOMY WITH INTRAOPERATIVE CHOLANGIOGRAM;  Surgeon: Valarie Merino, MD;  Location: [facility];   Service: General;  Laterality: N/A;  . COLONOSCOPY    . ESOPHAGOGASTRODUODENOSCOPY  05/12/2012   Procedure: ESOPHAGOGASTRODUODENOSCOPY (EGD);  Surgeon: Kandis Cocking, MD;  Location: [facility];  Service: General;  Laterality: N/A;  . HERNIA REPAIR  02/1995   inguinal  . LAPAROSCOPIC GASTRIC BANDING  12/29/2011   Procedure: LAPAROSCOPIC GASTRIC BANDING;  Surgeon: Valarie Merino, MD;  Location: [facility];  Service: General;  Laterality: N/A;    Current Outpatient Medications  Medication Sig Dispense Refill  . amphetamine-dextroamphetamine (ADDERALL) 20 MG tablet Take 20 mg 2 (two) times daily by mouth.  0  . calcium carbonate (CALCIUM 500) 1250 MG tablet Take 1 tablet by mouth 3 (three) times daily.     . cyclobenzaprine (FLEXERIL) 10 MG tablet Take 10 mg as needed by mouth.    . DiphenhydrAMINE HCl (BENADRYL PO) Take by mouth as needed.    . DULoxetine (CYMBALTA) 60 MG capsule Take 1 capsule by mouth daily.    Marland Kitchen FLUoxetine (PROZAC) 20 MG capsule Take 1 capsule by mouth daily.    Marland Kitchen gabapentin (NEURONTIN) 300 MG capsule Take 300 mg by mouth.    Marland Kitchen GILENYA 0.5 MG CAPS Take 1 tablet by mouth daily.    Marland Kitchen HYDROcodone-acetaminophen (NORCO/VICODIN) 5-325 MG per tablet Take by mouth as needed.    . Multiple Vitamin (MULTIVITAMIN WITH MINERALS)  TABS Take 1 tablet by mouth daily.    . norethindrone (MICRONOR,CAMILA,ERRIN) 0.35 MG tablet Take 1 tablet (0.35 mg total) daily by mouth. 3 Package 3  . olopatadine (PATANOL) 0.1 % ophthalmic solution INSTILL 1 DROP INTO BOTH EYES TWICE A DAY  11  . rosuvastatin (CRESTOR) 5 MG tablet TAKE 1 TABLET (5 MG TOTAL) BY MOUTH NIGHTLY.  11  . spironolactone (ALDACTONE) 50 MG tablet Take 50 mg by mouth daily. with food  2  . topiramate (TOPAMAX) 100 MG tablet Take 1 tablet by mouth 2 (two) times daily.    . TOVIAZ 8 MG TB24 tablet Take 1 tablet by mouth daily.    . valACYclovir (VALTREX) 1000 MG tablet TAKE 1 TABLET BY MOUTH  DAILY 90 tablet 0   No current  facility-administered medications for this visit.     Family History  Problem Relation Age of Onset  . Hyperlipidemia Mother   . Hypertension Mother   . Hypothyroidism Mother   . Cancer Mother        pituitary  . Hyperlipidemia Father   . Hypertension Father   . Heart disease Father   . Heart disease Brother 50       stent placement    Review of Systems  Exam:   There were no vitals taken for this visit.    General appearance: alert, cooperative and appears stated age Head: Normocephalic, without obvious abnormality, atraumatic Neck: no adenopathy, supple, symmetrical, trachea midline and thyroid normal to inspection and palpation Lungs: clear to auscultation bilaterally Breasts: normal appearance, no masses or tenderness, No nipple retraction or dimpling, No nipple discharge or bleeding, No axillary or supraclavicular adenopathy Heart: regular rate and rhythm Abdomen: soft, non-tender; no masses, no organomegaly Extremities: extremities normal, atraumatic, no cyanosis or edema Skin: Skin color, texture, turgor normal. No rashes or lesions Lymph nodes: Cervical, supraclavicular, and axillary nodes normal. No abnormal inguinal nodes palpated Neurologic: Grossly normal  Pelvic: External genitalia:  no lesions              Urethra:  normal appearing urethra with no masses, tenderness or lesions              Bartholins and Skenes: normal                 Vagina: normal appearing vagina with normal color and discharge, no lesions              Cervix: no lesions              Pap taken: {yes no:314532} Bimanual Exam:  Uterus:  normal size, contour, position, consistency, mobility, non-tender              Adnexa: no mass, fullness, tenderness              Rectal exam: {yes no:314532}.  Confirms.              Anus:  normal sphincter tone, no lesions  Chaperone was present for exam.  Assessment:   Well woman visit with normal exam.   Plan: Mammogram screening. Recommended  self breast awareness. Pap and HR HPV as above. Guidelines for Calcium, Vitamin D, regular exercise program including cardiovascular and weight bearing exercise.   Follow up annually and prn.   Additional counseling given.  {yes [id number]. _______ minutes face to face time of which over 50% was spent in counseling.    After visit summary provided.

## 2018-08-01 NOTE — Telephone Encounter (Signed)
Patient cancelled appointment today because she is sick. Will call back to reschedule.

## 2018-08-02 NOTE — Telephone Encounter (Signed)
Medication refill request: Valtrex 1000 mg  Last AEX:  06/29/17 Next AEX:  Canceled 08/01/18 Appointment Nothing rescheduled at this time  Last MMG (if hormonal medication request): 07/06/16 BIRADS 1 negative Refill authorized: #90 with 0 RF

## 2018-09-09 ENCOUNTER — Telehealth: Payer: Self-pay

## 2018-09-09 NOTE — Telephone Encounter (Signed)
Patient in 08 recall Hx lgsil and VAIN 1 2017. Please call to schedule.

## 2018-09-15 NOTE — Telephone Encounter (Signed)
Patient has appt. For AEX 09-21-2018.

## 2018-09-20 ENCOUNTER — Other Ambulatory Visit: Payer: Self-pay | Admitting: Obstetrics and Gynecology

## 2018-09-21 ENCOUNTER — Encounter: Payer: Self-pay | Admitting: Obstetrics and Gynecology

## 2018-09-21 ENCOUNTER — Ambulatory Visit: Payer: 59 | Admitting: Obstetrics and Gynecology

## 2018-09-21 ENCOUNTER — Other Ambulatory Visit (HOSPITAL_COMMUNITY)
Admission: RE | Admit: 2018-09-21 | Discharge: 2018-09-21 | Disposition: A | Discharge status: Home / Self Care | Payer: 59 | Source: Ambulatory Visit | Attending: Obstetrics and Gynecology | Admitting: Obstetrics and Gynecology

## 2018-09-21 ENCOUNTER — Other Ambulatory Visit: Payer: Self-pay

## 2018-09-21 VITALS — BP 100/66 | HR 100 | Resp 14 | Ht 69.0 in | Wt 156.6 lb

## 2018-09-21 DIAGNOSIS — Z23 Encounter for immunization: Secondary | ICD-10-CM

## 2018-09-21 DIAGNOSIS — Z01419 Encounter for gynecological examination (general) (routine) without abnormal findings: Secondary | ICD-10-CM | POA: Insufficient documentation

## 2018-09-21 NOTE — Progress Notes (Signed)
48 y.o. G0P0000 Single Caucasian female here for annual exam.    Menses regular.  Seems heavier than previously.  Patient would like her last Gardasil vaccine today.  PCP:   None  Patient's last menstrual period was 08/29/2018 (exact date).           Sexually active: No.  The current method of family planning is abstinence.    Exercising: No.  The patient does not participate in regular exercise at present. Smoker:  no  Health Maintenance: Pap: 06-29-17 Neg:Neg HR HPV History of abnormal Pap:  Yes,  08/10/16 colposcopy showing LGSIL of cervix and the vagina             06/16/16 ASCUS w/ +HR HPV             11/03/12, Negative withneg HR HPV MMG 09-30-17 Neg/density C/BiRads1--[facility].  She will schedule.  Colonoscopy: 06/17/15, tubular adenoma,repeat in 5 years(Pathology in Care Everywhere) BMD:   2013  Result :normal TDaP:  06-29-17 Gardasil:   Completed 2 of 3--last one 10-01-17 HIV: 07-19-17 NR Hep C: 07-19-17 Neg Screening Labs:  Hb today: PCP   reports that she has never smoked. She has never used smokeless tobacco. She reports that she does not drink alcohol or use drugs.  Past Medical History:  Diagnosis Date  . 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   . Colon polyps   . Contact lens/glasses fitting   . Depression   . Fatigue   . Gallbladder & bile duct stone, nonacute cholecystitis   . H/O renal calculi 7/10  & 5/12   X 2  . Hernia   . History of colon polyps   . History of colon polyps   . HSV-1 infection   . HSV-2 infection 2018  . Hyperlipidemia   . Multiple sclerosis (HCC) 12-23-11   dx.12'09-Recent tx. Tysabri(last IV tx. 09-21-11), only Ritalin now  . Neuromuscular disorder (HCC)    MS   . Peripheral vascular disease (HCC)    varicose veins, hx of superficial thrombus in leg.    Past Surgical History:  Procedure Laterality Date  . banding removed    . BREATH TEK H PYLORI  10/23/2011   Procedure:  BREATH TEK H PYLORI;  Surgeon: Valarie Merino, MD;  Location: [facility];  Service: General;  Laterality: N/A;  . CHOLECYSTECTOMY  06/14/2012   Procedure: LAPAROSCOPIC CHOLECYSTECTOMY WITH INTRAOPERATIVE CHOLANGIOGRAM;  Surgeon: Valarie Merino, MD;  Location: [facility];  Service: General;  Laterality: N/A;  . COLONOSCOPY    . ESOPHAGOGASTRODUODENOSCOPY  05/12/2012   Procedure: ESOPHAGOGASTRODUODENOSCOPY (EGD);  Surgeon: Kandis Cocking, MD;  Location: [facility];  Service: General;  Laterality: N/A;  . HERNIA REPAIR  02/1995   inguinal  . LAPAROSCOPIC GASTRIC BANDING  12/29/2011   Procedure: LAPAROSCOPIC GASTRIC BANDING;  Surgeon: Valarie Merino, MD;  Location: [facility];  Service: General;  Laterality: N/A;    Current Outpatient Medications  Medication Sig Dispense Refill  . amphetamine-dextroamphetamine (ADDERALL) 20 MG tablet Take 20 mg 2 (two) times daily by mouth.  0  . baclofen (LIORESAL) 10 MG tablet Take 10 mg by mouth 3 (three) times daily.    . calcium carbonate (CALCIUM 500) 1250 MG tablet Take 1 tablet by mouth 3 (three) times daily.     . DiphenhydrAMINE HCl (BENADRYL PO) Take by mouth as needed.    . DULoxetine (CYMBALTA) 60 MG capsule Take 1 capsule by  mouth daily.    Marland Kitchen. FLUoxetine (PROZAC) 40 MG capsule Take 1 capsule by mouth daily.    Marland Kitchen. gabapentin (NEURONTIN) 300 MG capsule Take 300 mg by mouth.    Marland Kitchen. GILENYA 0.5 MG CAPS Take 1 tablet by mouth daily.    . Multiple Vitamin (MULTIVITAMIN WITH MINERALS) TABS Take 1 tablet by mouth daily.    Marland Kitchen. olopatadine (PATANOL) 0.1 % ophthalmic solution INSTILL 1 DROP INTO BOTH EYES TWICE A DAY  11  . rosuvastatin (CRESTOR) 10 MG tablet Take 1 tablet by mouth daily.    . temazepam (RESTORIL) 15 MG capsule Take 1 capsule by mouth as needed.    . topiramate (TOPAMAX) 100 MG tablet Take 1 tablet by mouth 2 (two) times daily.    . TOVIAZ 8 MG TB24 tablet Take 1 tablet by mouth daily.    . valACYclovir (VALTREX) 1000 MG tablet TAKE 1 TABLET BY  MOUTH  DAILY 30 tablet 11   No current facility-administered medications for this visit.     Family History  Problem Relation Age of Onset  . Hyperlipidemia Mother   . Hypertension Mother   . Hypothyroidism Mother   . Cancer Mother        pituitary  . Hyperlipidemia Father   . Hypertension Father   . Heart disease Father   . Heart disease Brother 50       stent placement    Review of Systems  Musculoskeletal:       Joint and muscle weakness  Neurological:       Memory issues  All other systems reviewed and are negative.   Exam:   BP 100/66 (BP Location: Right Arm, Patient Position: Sitting, Cuff Size: Normal)   Pulse 100   Resp 14   Ht 5\' 9"  (1.753 m)   Wt 156 lb 9.6 oz (71 kg)   LMP 08/29/2018 (Exact Date)   BMI 23.13 kg/m     General appearance: alert, cooperative and appears stated age Head: Normocephalic, without obvious abnormality, atraumatic Neck: no adenopathy, supple, symmetrical, trachea midline and thyroid normal to inspection and palpation Lungs: clear to auscultation bilaterally Breasts: normal appearance, no masses or tenderness, No nipple retraction or dimpling, No nipple discharge or bleeding, No axillary or supraclavicular adenopathy Heart: regular rate and rhythm Abdomen: soft, non-tender; no masses, no organomegaly Extremities: extremities normal, atraumatic, no cyanosis or edema Skin: Skin color, texture, turgor normal. No rashes or lesions Lymph nodes: Cervical, supraclavicular, and axillary nodes normal. No abnormal inguinal nodes palpated Neurologic: Grossly normal  Pelvic: External genitalia:  no lesions              Urethra:  normal appearing urethra with no masses, tenderness or lesions              Bartholins and Skenes: normal                 Vagina: normal appearing vagina with normal color and discharge, no lesions              Cervix: no lesions              Pap taken: Yes.   Bimanual Exam:  Uterus:  normal size, contour, position,  consistency, mobility, non-tender              Adnexa: no mass, fullness, tenderness              Rectal exam: Yes.  .  Confirms.  Anus:  normal sphincter tone, no lesions  Chaperone was present for exam.  Assessment:   Well woman visit with normal exam. MS.  On Gilenya.  Positive for JC Virus.  Hx LGSIL.  Urinary frequency.  On Toviaz.  HSV I and II.  Hx superficial thrombus of leg.  Plan: Mammogram screening. Recommended self breast awareness. Pap and HR HPV as above. Guidelines for Calcium, Vitamin D, regular exercise program including cardiovascular and weight bearing exercise. Refill of Valtrex 1000 mg daily for one year.  Final HPV vaccine.  Follow up annually and prn.   After visit summary provided.

## 2018-09-21 NOTE — Telephone Encounter (Signed)
Medication refill request: Valtrex 1000 Last AEX:  06/29/17 Next AEX: Appointment at 9:30 today.  Last MMG (if hormonal medication request): never Refill authorized: #30 with 11 RF

## 2018-09-21 NOTE — Patient Instructions (Signed)

## 2018-09-23 LAB — CYTOLOGY - PAP
DIAGNOSIS: NEGATIVE
HPV (WINDOPATH): NOT DETECTED

## 2018-10-06 NOTE — Telephone Encounter (Signed)
09-21-18 had normal pap/Neg HR HPV. 02 recall

## 2019-02-14 ENCOUNTER — Encounter: Payer: Self-pay | Admitting: Obstetrics and Gynecology

## 2019-02-14 ENCOUNTER — Telehealth: Payer: Self-pay | Admitting: Obstetrics and Gynecology

## 2019-02-14 NOTE — Telephone Encounter (Signed)
Message  Good morning! I know we had decided to do the norethindrone 0.35mg  as BCP in a previous visit. I had d/c this because it was not necessary.     Would it be possible to restart med as a means of OC?     Thanks

## 2019-02-14 NOTE — Telephone Encounter (Signed)
Left message to call Nyna Chilton, RN at [facility] [phone].   

## 2019-02-15 MED ORDER — NORETHINDRONE 0.35 MG PO TABS: 1.0000 | ORAL_TABLET | Freq: Every day | ORAL | 2 refills | Status: DC

## 2019-02-15 NOTE — Telephone Encounter (Signed)
Patient is returning call to Jill, RN.  °

## 2019-02-15 NOTE — Telephone Encounter (Signed)
Spoke with patient. Patient is planning to become SA again, would like to restart POP. Has not taken POP in approximately 1 yr. LMP approximately 01/14/19. Denies any GYN concerns or complaints. Confirmed pharmacy on file. Advised I will review with Dr. Quincy Simmonds and return call with recommendations. Patient agreeable.   Last AEX 09/21/18 Last MMG at [facility] 10/12/18 negative, available in Care Everywhere.   Dr. Quincy Simmonds -please advise on POP Rx.

## 2019-02-15 NOTE — Telephone Encounter (Signed)
[place] for [place].  She can have refills until her annual exam is due in Jan. 2021.

## 2019-02-15 NOTE — Telephone Encounter (Signed)
Next AEX 09/27/19  Call returned to patient, left detailed message, ok per dpr, name identified on voicemail. Advised as seen below per Dr. Quincy Simmonds, Rx has been sent to [facility] as requested. May start POP with next menses. Return call to office if any additional questions. Encounter closed.

## 2019-08-23 ENCOUNTER — Other Ambulatory Visit: Payer: Self-pay | Admitting: Obstetrics and Gynecology

## 2019-08-24 NOTE — Telephone Encounter (Signed)
Medication refill request: Valacyclovir Last AEX:  09/21/18 BS Next AEX: 09/27/19 Last MMG (if hormonal medication request): 10/12/18 BIRADS 1 negative Refill authorized: Please advise on refill; order pended #90 w/3 refills if authorized

## 2019-09-26 NOTE — Progress Notes (Deleted)
49 y.o. G0P0000 Single Caucasian female here for annual exam.    PCP:     No LMP recorded.           Sexually active: {yes no:314532}  The current method of family planning is {contraception:315051}.    Exercising: {yes no:314532}  {types:19826} Smoker:  no  Health Maintenance: Pap: 09-21-18 Neg:Neg HR HPV,06-29-17 Neg:Neg HR HPV, 06-16-16 ASCUS:Pos HR HPV History of abnormal Pap:  Yes, 06/16/16 ASCUS w/ +HR HPV -- 08-10-16 colpo showing LGSIL of cervix and vagina. MMG: 10-12-18 Neg/density C/BiRads1--[facility] Colonoscopy:*** 06/17/15, tubular adenoma,repeat in 5 years(Pathology in Care Everywhere) BMD:   2013  Result :Normal TDaP: 06-29-17 Gardasil:   Yes, completed HIV:07-19-17 NR Hep C:07-19-17 Neg Screening Labs:  Hb today: ***, Urine today: ***   reports that she has never smoked. She has never used smokeless tobacco. She reports that she does not drink alcohol or use drugs.  Past Medical History:  Diagnosis Date  . 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   . Colon polyps   . Contact lens/glasses fitting   . Depression   . Fatigue   . Gallbladder & bile duct stone, nonacute cholecystitis   . H/O renal calculi 7/10  & 5/12   X 2  . Hernia   . History of colon polyps   . History of colon polyps   . HSV-1 infection   . HSV-2 infection 2018  . Hyperlipidemia   . Multiple sclerosis ([place]) 12-23-11   dx.12'09-Recent tx. Tysabri(last IV tx. 09-21-11), only Ritalin now  . Neuromuscular disorder ([place])    MS   . Peripheral vascular disease ([facility])    varicose veins, hx of superficial thrombus in leg.    Past Surgical History:  Procedure Laterality Date  . banding removed    . BREATH TEK H PYLORI  10/23/2011   Procedure: BREATH TEK H PYLORI;  Surgeon: Pedro Earls, MD;  Location: [facility];  Service: General;  Laterality: N/A;  . CHOLECYSTECTOMY  06/14/2012   Procedure: LAPAROSCOPIC CHOLECYSTECTOMY WITH INTRAOPERATIVE  CHOLANGIOGRAM;  Surgeon: Pedro Earls, MD;  Location: [facility];  Service: General;  Laterality: N/A;  . COLONOSCOPY    . ESOPHAGOGASTRODUODENOSCOPY  05/12/2012   Procedure: ESOPHAGOGASTRODUODENOSCOPY (EGD);  Surgeon: Shann Medal, MD;  Location: [facility];  Service: General;  Laterality: N/A;  . HERNIA REPAIR  02/1995   inguinal  . LAPAROSCOPIC GASTRIC BANDING  12/29/2011   Procedure: LAPAROSCOPIC GASTRIC BANDING;  Surgeon: Pedro Earls, MD;  Location: [facility];  Service: General;  Laterality: N/A;    Current Outpatient Medications  Medication Sig Dispense Refill  . amphetamine-dextroamphetamine (ADDERALL) 20 MG tablet Take 20 mg 2 (two) times daily by mouth.  0  . baclofen (LIORESAL) 10 MG tablet Take 10 mg by mouth 3 (three) times daily.    . calcium carbonate (CALCIUM 500) 1250 MG tablet Take 1 tablet by mouth 3 (three) times daily.     . DiphenhydrAMINE HCl (BENADRYL PO) Take by mouth as needed.    . DULoxetine (CYMBALTA) 60 MG capsule Take 1 capsule by mouth daily.    Marland Kitchen FLUoxetine (PROZAC) 40 MG capsule Take 1 capsule by mouth daily.    Marland Kitchen gabapentin (NEURONTIN) 300 MG capsule Take 300 mg by mouth.    Marland Kitchen GILENYA 0.5 MG CAPS Take 1 tablet by mouth daily.    . Multiple Vitamin (MULTIVITAMIN WITH MINERALS) TABS Take 1 tablet by mouth daily.    Marland Kitchen  norethindrone (MICRONOR) 0.35 MG tablet Take 1 tablet (0.35 mg total) by mouth daily. 3 Package 2  . olopatadine (PATANOL) 0.1 % ophthalmic solution INSTILL 1 DROP INTO BOTH EYES TWICE A DAY  11  . rosuvastatin (CRESTOR) 10 MG tablet Take 1 tablet by mouth daily.    . temazepam (RESTORIL) 15 MG capsule Take 1 capsule by mouth as needed.    . topiramate (TOPAMAX) 100 MG tablet Take 1 tablet by mouth 2 (two) times daily.    . TOVIAZ 8 MG TB24 tablet Take 1 tablet by mouth daily.    . valACYclovir (VALTREX) 1000 MG tablet TAKE 1 TABLET BY MOUTH  DAILY 90 tablet 0   No current facility-administered medications for this visit.    Family  History  Problem Relation Age of Onset  . Hyperlipidemia Mother   . Hypertension Mother   . Hypothyroidism Mother   . Cancer Mother        pituitary  . Hyperlipidemia Father   . Hypertension Father   . Heart disease Father   . Heart disease Brother 50       stent placement    Review of Systems  Exam:   There were no vitals taken for this visit.    General appearance: alert, cooperative and appears stated age Head: normocephalic, without obvious abnormality, atraumatic Neck: no adenopathy, supple, symmetrical, trachea midline and thyroid normal to inspection and palpation Lungs: clear to auscultation bilaterally Breasts: normal appearance, no masses or tenderness, No nipple retraction or dimpling, No nipple discharge or bleeding, No axillary adenopathy Heart: regular rate and rhythm Abdomen: soft, non-tender; no masses, no organomegaly Extremities: extremities normal, atraumatic, no cyanosis or edema Skin: skin color, texture, turgor normal. No rashes or lesions Lymph nodes: cervical, supraclavicular, and axillary nodes normal. Neurologic: grossly normal  Pelvic: External genitalia:  no lesions              No abnormal inguinal nodes palpated.              Urethra:  normal appearing urethra with no masses, tenderness or lesions              Bartholins and Skenes: normal                 Vagina: normal appearing vagina with normal color and discharge, no lesions              Cervix: no lesions              Pap taken: {yes no:314532} Bimanual Exam:  Uterus:  normal size, contour, position, consistency, mobility, non-tender              Adnexa: no mass, fullness, tenderness              Rectal exam: {yes no:314532}.  Confirms.              Anus:  normal sphincter tone, no lesions  Chaperone was present for exam.  Assessment:   Well woman visit with normal exam.   Plan: Mammogram screening discussed. Self breast awareness reviewed. Pap and HR HPV as above. Guidelines for  Calcium, Vitamin D, regular exercise program including cardiovascular and weight bearing exercise.   Follow up annually and prn.   Additional counseling given.  {yes [id number]. _______ minutes face to face time of which over 50% was spent in counseling.    After visit summary provided.

## 2019-09-27 ENCOUNTER — Encounter: Payer: Self-pay | Admitting: Obstetrics and Gynecology

## 2019-09-27 ENCOUNTER — Ambulatory Visit: Payer: 59 | Admitting: Obstetrics and Gynecology

## 2019-10-21 ENCOUNTER — Other Ambulatory Visit: Payer: Self-pay | Admitting: Obstetrics and Gynecology

## 2019-10-21 DIAGNOSIS — Z01419 Encounter for gynecological examination (general) (routine) without abnormal findings: Secondary | ICD-10-CM

## 2019-10-23 ENCOUNTER — Encounter: Payer: Self-pay | Admitting: Obstetrics and Gynecology

## 2019-10-23 NOTE — Telephone Encounter (Signed)
Left detailed message for pt to call back with updated MMG.

## 2019-10-23 NOTE — Telephone Encounter (Signed)
Med refill request: micronor Last AEX: 09/21/2018 Next AEX: 11/15/2019 Last MMG (if hormonal med) 09/18/2011 , Birads 1, negative Refill authorized: # 28, 0RF, pended if approved.

## 2019-10-23 NOTE — Telephone Encounter (Signed)
Please get copy of updated mammogram.  We do not have anything recent in her chart.

## 2019-11-01 NOTE — Telephone Encounter (Signed)
Rx Micronor sent to pharmacy on file # 28, [id number]. Pt has AEX on 11/15/2019.    Routing to Dr Edward Jolly and will close encounter.

## 2019-11-01 NOTE — Telephone Encounter (Signed)
Left detailed message for pt per DPR. Pt informed of Rx for Micronor and encouraged to keep AEX on 11/15/2019 for more refills. Pt to call back to office with any questions.   Routing to Dr Edward Jolly for review and Rx request. Rx pended for # 28/1 package, 0RF.

## 2019-11-13 ENCOUNTER — Other Ambulatory Visit: Payer: Self-pay | Admitting: Obstetrics and Gynecology

## 2019-11-14 NOTE — Telephone Encounter (Signed)
Medication refill request: Valtrex Last AEX:  09-21-2018 BS Next AEX: 11-15-19 Last MMG (if hormonal medication request): n/a Refill authorized: Today, please advise.   Medication pended for #90, 0RF. Please refill if appropriate.

## 2019-11-14 NOTE — Telephone Encounter (Signed)
Message left to return call to [facility] at [phone].   Need patient to advise if she has enough Valtrex to last until aex appointment tomorrow or if she will need refilled prior to appointment.

## 2019-11-14 NOTE — Progress Notes (Signed)
49 y.o. G0P0000 Single Caucasian female here for annual exam.    On Micronor.  Wants to continue.   States she is tired.  Working a lot.  Has MS.   Gala Murdoch work ing well for her.   Needs refill of Valtrex.  Normal Creatinine in June, 2020.   See Care Everywhere.   Does labs at [facility] yearly. See Care Everywhere.   PCP:  None   Patient's last menstrual period was 10/25/2019 (approximate).    Period Cycle (Days): 30 Period Duration (Days): 5-6 days Period Pattern: Regular Menstrual Flow: Moderate Menstrual Control: Tampon, Maxi pad Menstrual Control Change Freq (Hours): every 6 hours on heaviest day Dysmenorrhea: None     Sexually active: Yes.    The current method of family planning is oral progesterone-only contraceptive--Micronor.    Exercising: No.  The patient does not participate in regular exercise at present. Smoker:  no  Health Maintenance: Pap: 09-21-18 Neg:Neg HR HPV, 06-29-17 Neg:Neg HR HPV History of abnormal Pap:  Yes,12/18/17colposcopy showing LGSIL of cervix and the vagina, 06/16/16 ASCUS w/ +HR HPV, 11/03/12, Negative withneg HR HPV MMG: 10-12-18 Neg/density C/Birads1--[facility]--knows she needs to schedule Colonoscopy: 06/17/15, tubular adenoma;next 5 yrs.  [place] [facility].  [id number]  Result :Normal TDaP:  06-29-17 Gardasil:   yes HIV: 07-19-17 NR Hep C: 07-19-17 Neg Screening Labs:     reports that she has never smoked. She has never used smokeless tobacco. She reports that she does not drink alcohol or use drugs.  Past Medical History:  Diagnosis Date  . 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   . Colon polyps   . Contact lens/glasses fitting   . Depression   . Fatigue   . Gallbladder & bile duct stone, nonacute cholecystitis   . H/O renal calculi 7/10  & 5/12   X 2  . Hernia   . History of colon polyps   . History of colon polyps   . HSV-1 infection   . HSV-2 infection 2018  .  Hyperlipidemia   . Multiple sclerosis (HCC) 12-23-11   dx.12'09-Recent tx. Tysabri(last IV tx. 09-21-11), only Ritalin now  . Neuromuscular disorder (HCC)    MS   . Peripheral vascular disease (HCC)    varicose veins, hx of superficial thrombus in leg.    Past Surgical History:  Procedure Laterality Date  . banding removed    . BREATH TEK H PYLORI  10/23/2011   Procedure: BREATH TEK H PYLORI;  Surgeon: Valarie Merino, MD;  Location: [facility];  Service: General;  Laterality: N/A;  . CHOLECYSTECTOMY  06/14/2012   Procedure: LAPAROSCOPIC CHOLECYSTECTOMY WITH INTRAOPERATIVE CHOLANGIOGRAM;  Surgeon: Valarie Merino, MD;  Location: [facility];  Service: General;  Laterality: N/A;  . COLONOSCOPY    . ESOPHAGOGASTRODUODENOSCOPY  05/12/2012   Procedure: ESOPHAGOGASTRODUODENOSCOPY (EGD);  Surgeon: Kandis Cocking, MD;  Location: [facility];  Service: General;  Laterality: N/A;  . HERNIA REPAIR  02/1995   inguinal  . LAPAROSCOPIC GASTRIC BANDING  12/29/2011   Procedure: LAPAROSCOPIC GASTRIC BANDING;  Surgeon: Valarie Merino, MD;  Location: [facility];  Service: General;  Laterality: N/A;    Current Outpatient Medications  Medication Sig Dispense Refill  . amphetamine-dextroamphetamine (ADDERALL) 20 MG tablet Take 20 mg 2 (two) times daily by mouth.  0  . baclofen (LIORESAL) 10 MG tablet Take 10 mg by mouth 3 (three) times daily.    . calcium carbonate (CALCIUM  500) 1250 MG tablet Take 1 tablet by mouth 3 (three) times daily.     . Cholecalciferol (VITAMIN D3) 1.25 MG (50000 UT) CAPS Take 1 capsule by mouth once a week.    . DiphenhydrAMINE HCl (BENADRYL PO) Take by mouth as needed.    . DULoxetine (CYMBALTA) 60 MG capsule Take 1 capsule by mouth daily.    Marland Kitchen FLUoxetine (PROZAC) 40 MG capsule Take 1 capsule by mouth daily.    Marland Kitchen gabapentin (NEURONTIN) 300 MG capsule Take 300 mg by mouth.    Marland Kitchen GILENYA 0.5 MG CAPS Take 1 tablet by mouth daily.    . norethindrone (MICRONOR) 0.35 MG tablet Take 1 tablet  (0.35 mg total) by mouth daily. 3 Package 3  . primidone (MYSOLINE) 50 MG tablet Take 50 mg by mouth 2 (two) times daily.    . rosuvastatin (CRESTOR) 10 MG tablet Take 1 tablet by mouth daily.    . temazepam (RESTORIL) 15 MG capsule Take 1 capsule by mouth as needed.    . topiramate (TOPAMAX) 100 MG tablet Take 1 tablet by mouth 2 (two) times daily.    . TOVIAZ 4 MG TB24 tablet Take 1 tablet by mouth 2 (two) times daily.    . valACYclovir (VALTREX) 500 MG tablet Take 1 tablet (500 mg total) by mouth daily. 90 tablet 3   No current facility-administered medications for this visit.    Family History  Problem Relation Age of Onset  . Hyperlipidemia Mother   . Hypertension Mother   . Hypothyroidism Mother   . Cancer Mother        pituitary  . Hyperlipidemia Father   . Hypertension Father   . Heart disease Father   . Heart disease Brother 36       stent placement    Review of Systems  All other systems reviewed and are negative.   Exam:   BP 116/66   Pulse 80   Temp 98.4 F (36.9 C) (Temporal)   Resp 14   Ht 5' 8.5" (1.74 m)   Wt 145 lb 9.6 oz (66 kg)   LMP 10/25/2019 (Approximate)   BMI 21.82 kg/m     General appearance: alert, cooperative and appears stated age Head: normocephalic, without obvious abnormality, atraumatic Neck: no adenopathy, supple, symmetrical, trachea midline and thyroid normal to inspection and palpation Lungs: clear to auscultation bilaterally Breasts: normal appearance, no masses or tenderness, No nipple retraction or dimpling, No nipple discharge or bleeding, No axillary adenopathy Heart: regular rate and rhythm Abdomen: soft, non-tender; no masses, no organomegaly Extremities: extremities normal, atraumatic, no cyanosis or edema Skin: skin color, texture, turgor normal. No rashes or lesions Lymph nodes: cervical, supraclavicular, and axillary nodes normal. Neurologic: grossly normal  Pelvic: External genitalia:  no lesions              No  abnormal inguinal nodes palpated.              Urethra:  normal appearing urethra with no masses, tenderness or lesions              Bartholins and Skenes: normal                 Vagina: normal appearing vagina with normal color and discharge, no lesions              Cervix: no lesions              Pap taken: No. Bimanual Exam:  Uterus:  normal size, contour, position, consistency, mobility, non-tender              Adnexa: no mass, fullness, tenderness              Rectal exam: Yes.  .  Confirms.              Anus:  normal sphincter tone, no lesions  Chaperone was present for exam.  Assessment:   Well woman visit with normal exam. MS. On Gilenya.  Positive for JC Virus.  Hx LGSIL. Urinary frequency.  On Toviaz.  HSV I and II.  Hx superficial thrombus of leg.  Plan: Mammogram screening discussed. Self breast awareness reviewed. Pap and HR HPV 2023.  5 year risk of CIN 3+ is 0.18%.  Guidelines for Calcium, Vitamin D, regular exercise program including cardiovascular and weight bearing exercise. Refill of Micronor.  She understands that Primidone can decrease effectiveness of the Micronor.  Refill of Valtrex will reduce to 500 mg daily.  Follow up annually and prn.   After visit summary provided.

## 2019-11-15 ENCOUNTER — Telehealth: Payer: Self-pay | Admitting: Obstetrics and Gynecology

## 2019-11-15 ENCOUNTER — Encounter: Payer: Self-pay | Admitting: Obstetrics and Gynecology

## 2019-11-15 ENCOUNTER — Other Ambulatory Visit: Payer: Self-pay

## 2019-11-15 ENCOUNTER — Ambulatory Visit: Payer: 59 | Admitting: Obstetrics and Gynecology

## 2019-11-15 DIAGNOSIS — Z01419 Encounter for gynecological examination (general) (routine) without abnormal findings: Secondary | ICD-10-CM | POA: Diagnosis not present

## 2019-11-15 MED ORDER — NORETHINDRONE 0.35 MG PO TABS: 1.0000 | ORAL_TABLET | Freq: Every day | ORAL | 3 refills | Status: DC

## 2019-11-15 MED ORDER — VALACYCLOVIR HCL 500 MG PO TABS: 500.0000 mg | ORAL_TABLET | Freq: Every day | ORAL | 3 refills | Status: DC

## 2019-11-15 NOTE — Patient Instructions (Signed)

## 2019-11-15 NOTE — Telephone Encounter (Signed)
Please place patient in pap recall for 2023.   She has a history of LGSIL and positive HR HPV.

## 2019-11-15 NOTE — Telephone Encounter (Signed)
36 month recall entered 11-14-21.

## 2020-09-08 ENCOUNTER — Other Ambulatory Visit: Payer: Self-pay | Admitting: Obstetrics and Gynecology

## 2020-09-10 NOTE — Telephone Encounter (Signed)
Left message to call triage at [facility], [phone].  Last AEX 11/15/19 Next AEX 11/19/20  Valtrex 500 mg #90/3RF sent on 11/15/19.

## 2020-09-11 NOTE — Telephone Encounter (Signed)
Call returned to patient, left message asking for clarification on Rx refill request received. Last sent 11/15/19 #90/3RF. Next AEX 11/15/19, does she have enough until next AEX? Return call to notify.

## 2020-09-11 NOTE — Telephone Encounter (Signed)
Left message to call Korie Brabson, RN at [facility], [phone].  

## 2020-11-19 ENCOUNTER — Ambulatory Visit: Payer: 59 | Admitting: Obstetrics and Gynecology

## 2020-11-19 ENCOUNTER — Other Ambulatory Visit (HOSPITAL_COMMUNITY)
Admission: RE | Admit: 2020-11-19 | Discharge: 2020-11-19 | Disposition: A | Discharge status: Home / Self Care | Payer: 59 | Source: Ambulatory Visit | Attending: Obstetrics and Gynecology | Admitting: Obstetrics and Gynecology

## 2020-11-19 ENCOUNTER — Other Ambulatory Visit: Payer: Self-pay

## 2020-11-19 ENCOUNTER — Encounter: Payer: Self-pay | Admitting: Obstetrics and Gynecology

## 2020-11-19 VITALS — BP 116/72 | HR 89 | Ht 68.5 in | Wt 139.0 lb

## 2020-11-19 DIAGNOSIS — Z113 Encounter for screening for infections with a predominantly sexual mode of transmission: Secondary | ICD-10-CM

## 2020-11-19 DIAGNOSIS — N760 Acute vaginitis: Secondary | ICD-10-CM

## 2020-11-19 DIAGNOSIS — Z01419 Encounter for gynecological examination (general) (routine) without abnormal findings: Secondary | ICD-10-CM | POA: Diagnosis not present

## 2020-11-19 MED ORDER — NORETHINDRONE 0.35 MG PO TABS: 1.0000 | ORAL_TABLET | Freq: Every day | ORAL | 3 refills | Status: DC

## 2020-11-19 MED ORDER — VALACYCLOVIR HCL 500 MG PO TABS: 500.0000 mg | ORAL_TABLET | Freq: Every day | ORAL | 3 refills | Status: DC

## 2020-11-19 NOTE — Progress Notes (Signed)
50 y.o. G0P0000 Single Caucasian female here for annual exam.    Taking POPs.   Vulvar irritation.   Lost 10 pounds.  Swelling in node of left neck.  Wonders if she has mono.  Desires STD screening.   Received her booster for Covid.   Half brother died from Covid.   PCP:  Synetta Fail, MD   Patient's last menstrual period was 11/12/2020 (exact date).     Period Cycle (Days): 30 Period Duration (Days): 5-6 Period Pattern: Regular Menstrual Flow:  (different each month--heavier in the last month) Menstrual Control: Tampon,Maxi pad Dysmenorrhea: (!) Mild Dysmenorrhea Symptoms: Cramping     Sexually active: Yes.    The current method of family planning is oral progesterone-only contraceptive.    Exercising: No.  The patient does not participate in regular exercise at present. Smoker:  no  Health Maintenance: Pap:  09-21-18 Neg:Neg HR HPV, 06-29-17 Neg:Neg HR HPV, 06-16-16 ASCUS:Pos HR HPV History of abnormal Pap:  Yes, 08-10-16 colpo revealed LGSIL of cervix and vagina;06-16-16 ASCUS:Pos HR HPV MMG: 05-16-20 Neg/Birads1 Colonoscopy: 10-30-20 polyps;next 2025 BMD: 2013  Result :Normal TDaP: 06-29-17 Gardasil:   Yes, completed HIV:07-19-17 NR Hep C:07-19-17 Neg Screening Labs:  PCP.   reports that she has never smoked. She has never used smokeless tobacco. She reports that she does not drink alcohol and does not use drugs.  Past Medical History:  Diagnosis Date  . 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   . Colon polyps   . Contact lens/glasses fitting   . Depression   . Fatigue   . Gallbladder & bile duct stone, nonacute cholecystitis   . H/O renal calculi 7/10  & 5/12   X 2  . Hernia   . History of colon polyps   . History of colon polyps   . HSV-1 infection   . HSV-2 infection 2018  . Hyperlipidemia   . Multiple sclerosis (HCC) 12-23-11   dx.12'09-Recent tx. Tysabri(last IV tx. 09-21-11), only Ritalin now  .  Neuromuscular disorder (HCC)    MS   . Peripheral vascular disease (HCC)    varicose veins, hx of superficial thrombus in leg.    Past Surgical History:  Procedure Laterality Date  . banding removed    . BREATH TEK H PYLORI  10/23/2011   Procedure: BREATH TEK H PYLORI;  Surgeon: Valarie Merino, MD;  Location: [facility];  Service: General;  Laterality: N/A;  . CHOLECYSTECTOMY  06/14/2012   Procedure: LAPAROSCOPIC CHOLECYSTECTOMY WITH INTRAOPERATIVE CHOLANGIOGRAM;  Surgeon: Valarie Merino, MD;  Location: [facility];  Service: General;  Laterality: N/A;  . COLONOSCOPY    . ESOPHAGOGASTRODUODENOSCOPY  05/12/2012   Procedure: ESOPHAGOGASTRODUODENOSCOPY (EGD);  Surgeon: Kandis Cocking, MD;  Location: [facility];  Service: General;  Laterality: N/A;  . HERNIA REPAIR  02/1995   inguinal  . LAPAROSCOPIC GASTRIC BANDING  12/29/2011   Procedure: LAPAROSCOPIC GASTRIC BANDING;  Surgeon: Valarie Merino, MD;  Location: [facility];  Service: General;  Laterality: N/A;    Current Outpatient Medications  Medication Sig Dispense Refill  . amphetamine-dextroamphetamine (ADDERALL) 20 MG tablet Take 20 mg 2 (two) times daily by mouth.  0  . baclofen (LIORESAL) 10 MG tablet Take 10 mg by mouth 3 (three) times daily.    . busPIRone (BUSPAR) 15 MG tablet Take 15 mg by mouth daily.    . Cholecalciferol (VITAMIN D3) 1.25 MG (50000 UT) CAPS Take 1 capsule by  mouth once a week.    . DiphenhydrAMINE HCl (BENADRYL PO) Take by mouth as needed.    . doxycycline (PERIOSTAT) 20 MG tablet Take 20 mg by mouth 2 (two) times daily.    . DULoxetine (CYMBALTA) 60 MG capsule Take 1 capsule by mouth daily.    Marland Kitchen FLUoxetine (PROZAC) 40 MG capsule Take 1 capsule by mouth daily.    Marland Kitchen gabapentin (NEURONTIN) 300 MG capsule Take 300 mg by mouth.    . norethindrone (MICRONOR) 0.35 MG tablet Take 1 tablet (0.35 mg total) by mouth daily. 3 Package 3  . primidone (MYSOLINE) 50 MG tablet Take 50 mg by mouth 2 (two) times daily.    .  riTUXimab-pvvr (RUXIENCE IV) Inject into the vein.    . rosuvastatin (CRESTOR) 10 MG tablet Take 1 tablet by mouth daily.    . temazepam (RESTORIL) 15 MG capsule Take 1 capsule by mouth as needed.    . TOVIAZ 4 MG TB24 tablet Take 1 tablet by mouth 2 (two) times daily.    . valACYclovir (VALTREX) 500 MG tablet Take 1 tablet (500 mg total) by mouth daily. 90 tablet 3   No current facility-administered medications for this visit.    Family History  Problem Relation Age of Onset  . Hyperlipidemia Mother   . Hypertension Mother   . Hypothyroidism Mother   . Cancer Mother        pituitary  . Hyperlipidemia Father   . Hypertension Father   . Heart disease Father   . Heart disease Brother 50       stent placement    Review of Systems  All other systems reviewed and are negative.   Exam:   BP 116/72   Pulse 89   Ht 5' 8.5" (1.74 m)   Wt 139 lb (63 kg)   LMP 11/12/2020 (Exact Date)   SpO2 98%   BMI 20.83 kg/m     General appearance: alert, cooperative and appears stated age Head: normocephalic, without obvious abnormality, atraumatic Neck: no adenopathy, supple, symmetrical, trachea midline and thyroid normal to inspection and palpation Lungs: clear to auscultation bilaterally Breasts: normal appearance, no masses or tenderness, No nipple retraction or dimpling, No nipple discharge or bleeding, No axillary adenopathy Heart: regular rate and rhythm Abdomen: soft, non-tender; no masses, no organomegaly Extremities: extremities normal, atraumatic, no cyanosis or edema Skin: skin color, texture, turgor normal. No rashes or lesions Lymph nodes: supraclavicular, and axillary nodes normal.  Small left cervical node palpable. Neurologic: grossly normal  Pelvic: External genitalia:  Vulva irritated.              No abnormal inguinal nodes palpated.              Urethra:  normal appearing urethra with no masses, tenderness or lesions              Bartholins and Skenes: normal                  Vagina: normal appearing vagina with normal color and discharge, no lesions              Cervix: no lesions              Pap taken: No.  Bimanual Exam:  Uterus:  normal size, contour, position, consistency, mobility, non-tender              Adnexa: no mass, fullness, tenderness  Rectal exam: Yes.  .  Confirms.              Anus:  normal sphincter tone, no lesions  Chaperone was present for exam.  Assessment:   Well woman visit with normal exam. MS.  Positive for JC Virus.  Hx LGSIL. Urinary frequency. On Toviaz.  HSV I and II.  Hx superficial thrombus of leg. Vulvovaginitis.  Left cervical adenopathy.  Plan: Mammogram screening discussed. Self breast awareness reviewed. Pap and HR HPV as above. Guidelines for Calcium, Vitamin D, regular exercise program including cardiovascular and weight bearing exercise. STD and vaginitis testing.  Refill of POPs.  We briefly discussed Mirena IUD.   She will see her PCP in about 2 weeks and discuss left cervical node enlargement if persists. Follow up annually and prn.

## 2020-11-19 NOTE — Patient Instructions (Signed)

## 2020-11-20 LAB — CERVICOVAGINAL ANCILLARY ONLY
Bacterial Vaginitis (gardnerella): POSITIVE — AB
Candida Glabrata: NEGATIVE
Candida Vaginitis: POSITIVE — AB
Chlamydia: NEGATIVE
Comment: NEGATIVE
Comment: NEGATIVE
Comment: NEGATIVE
Comment: NEGATIVE
Comment: NEGATIVE
Comment: NORMAL
Neisseria Gonorrhea: NEGATIVE
Trichomonas: NEGATIVE

## 2020-11-20 LAB — HEPATITIS B SURFACE ANTIGEN: Hepatitis B Surface Ag: NONREACTIVE

## 2020-11-20 LAB — HEPATITIS C ANTIBODY
Hepatitis C Ab: NONREACTIVE
SIGNAL TO CUT-OFF: 0.01 (ref <1.00)

## 2020-11-20 LAB — RPR: RPR Ser Ql: NONREACTIVE

## 2020-11-20 LAB — HIV ANTIBODY (ROUTINE TESTING W REFLEX): HIV 1&2 Ab, 4th Generation: NONREACTIVE

## 2020-11-21 ENCOUNTER — Other Ambulatory Visit: Payer: Self-pay

## 2020-11-21 MED ORDER — METRONIDAZOLE 500 MG PO TABS: 500.0000 mg | ORAL_TABLET | Freq: Two times a day (BID) | ORAL | 0 refills | Status: DC

## 2020-11-21 MED ORDER — FLUCONAZOLE 150 MG PO TABS: ORAL_TABLET | ORAL | 0 refills | Status: DC

## 2020-11-25 ENCOUNTER — Other Ambulatory Visit: Payer: Self-pay | Admitting: Obstetrics and Gynecology

## 2020-11-25 DIAGNOSIS — Z01419 Encounter for gynecological examination (general) (routine) without abnormal findings: Secondary | ICD-10-CM

## 2021-07-08 ENCOUNTER — Encounter: Payer: Self-pay | Admitting: Obstetrics and Gynecology

## 2021-09-22 ENCOUNTER — Other Ambulatory Visit: Payer: Self-pay | Admitting: Obstetrics and Gynecology

## 2021-09-23 NOTE — Telephone Encounter (Signed)
AEX 11/19/20 

## 2021-10-21 ENCOUNTER — Other Ambulatory Visit: Payer: Self-pay | Admitting: Obstetrics and Gynecology

## 2021-10-21 NOTE — Telephone Encounter (Signed)
Last annual was 10/2020 Scheduled on 11/26/21

## 2021-11-12 NOTE — Progress Notes (Deleted)
51 y.o. G0P0000 Single Caucasian female here for annual exam.   ? ?PCP: none   ? ?No LMP recorded.     ?  ?    ?Sexually active: {yes no:314532}  ?The current method of family planning is oral progesterone-only contraceptive.    ?Exercising: {yes no:314532}  {types:19826} ?Smoker:  {YES NO:22349} ? ?Health Maintenance: ?Pap: 09-21-18 Neg:Neg HR HPV, 06-29-17 Neg:Neg HR HPV, 06-16-16 ASCUS:Pos HR HPV ?History of abnormal Pap: yes, 08-10-16 colpo revealed LGSIL of cervix and vagina;06-16-16 ASCUS:Pos HR HPV ?MMG: 07/04/21- neg birads 1 ?Colonoscopy: 10/30/20 polyps; f/u 2025 ?BMD: 2013  Result WNL ?TDaP: 06/29/17 ?Gardasil: completed  ?HIV: 11/19/20-NR ?Hep C: 11/19/20-NR ?Screening Labs:  Hb today: ***, Urine today: *** ? ? reports that she has never smoked. She has never used smokeless tobacco. She reports that she does not drink alcohol and does not use drugs. ? ?Past Medical History:  ?Diagnosis Date  ? 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 LGSIL of cervix and VAIN I noted at colposcopy.   ? Colon polyps   ? Contact lens/glasses fitting   ? Depression   ? Fatigue   ? Gallbladder & bile duct stone, nonacute cholecystitis   ? H/O renal calculi 7/10  & 5/12  ? X 2  ? Hernia   ? History of colon polyps   ? History of colon polyps   ? 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 Hyperlipidemia   ? Multiple sclerosis (HCC) 12-23-11  ? dx.12'09-Recent tx. Tysabri(last IV tx. 09-21-11), only Ritalin now  ? Neuromuscular disorder (HCC)   ? MS   ? Peripheral vascular disease (HCC)   ? varicose veins, hx of superficial thrombus in leg.  ? ? ?Past Surgical History:  ?Procedure Laterality Date  ? banding removed    ? BREATH TEK H PYLORI  10/23/2011  ? Procedure: BREATH TEK H PYLORI;  Surgeon: Valarie Merino, MD;  Location: [facility];  Service: General;  Laterality: N/A;  ? CHOLECYSTECTOMY  06/14/2012  ? Procedure: LAPAROSCOPIC CHOLECYSTECTOMY WITH INTRAOPERATIVE CHOLANGIOGRAM;  Surgeon: Valarie Merino, MD;  Location: [facility];   Service: General;  Laterality: N/A;  ? COLONOSCOPY    ? ESOPHAGOGASTRODUODENOSCOPY  05/12/2012  ? Procedure: ESOPHAGOGASTRODUODENOSCOPY (EGD);  Surgeon: Kandis Cocking, MD;  Location: [facility];  Service: General;  Laterality: N/A;  ? HERNIA REPAIR  02/1995  ? inguinal  ? LAPAROSCOPIC GASTRIC BANDING  12/29/2011  ? Procedure: LAPAROSCOPIC GASTRIC BANDING;  Surgeon: Valarie Merino, MD;  Location: [facility];  Service: General;  Laterality: N/A;  ? ? ?Current Outpatient Medications  ?Medication Sig Dispense Refill  ? amphetamine-dextroamphetamine (ADDERALL) 20 MG tablet Take 20 mg 2 (two) times daily by mouth.  0  ? baclofen (LIORESAL) 10 MG tablet Take 10 mg by mouth 3 (three) times daily.    ? busPIRone (BUSPAR) 15 MG tablet Take 15 mg by mouth daily.    ? Cholecalciferol (VITAMIN D3) 1.25 MG (50000 UT) CAPS Take 1 capsule by mouth once a week.    ? DiphenhydrAMINE HCl (BENADRYL PO) Take by mouth as needed.    ? doxycycline (PERIOSTAT) 20 MG tablet Take 20 mg by mouth 2 (two) times daily.    ? DULoxetine (CYMBALTA) 60 MG capsule Take 1 capsule by mouth daily.    ? fluconazole (DIFLUCAN) 150 MG tablet Take one tablet po and repeat in 72 hours prn. 2 tablet 0  ? FLUoxetine (PROZAC) 40 MG capsule Take 1 capsule by mouth  daily.    ? gabapentin (NEURONTIN) 300 MG capsule Take 300 mg by mouth.    ? metroNIDAZOLE (FLAGYL) 500 MG tablet Take 1 tablet (500 mg total) by mouth 2 (two) times daily. 14 tablet 0  ? norethindrone (MICRONOR) 0.35 MG tablet TAKE 1 TABLET BY MOUTH EVERY DAY 28 tablet 11  ? primidone (MYSOLINE) 50 MG tablet Take 50 mg by mouth 2 (two) times daily.    ? riTUXimab-pvvr (RUXIENCE IV) Inject into the vein.    ? rosuvastatin (CRESTOR) 10 MG tablet Take 1 tablet by mouth daily.    ? temazepam (RESTORIL) 15 MG capsule Take 1 capsule by mouth as needed.    ? TOVIAZ 4 MG TB24 tablet Take 1 tablet by mouth 2 (two) times daily.    ? valACYclovir (VALTREX) 500 MG tablet TAKE 1 TABLET BY MOUTH  DAILY 90 tablet 0   ? ?No current facility-administered medications for this visit.  ? ? ?Family History  ?Problem Relation Age of Onset  ? Hyperlipidemia Mother   ? Hypertension Mother   ? Hypothyroidism Mother   ? Cancer Mother   ?     pituitary  ? Hyperlipidemia Father   ? Hypertension Father   ? Heart disease Father   ? Heart disease Brother 50  ?     stent placement  ? ? ?Review of Systems ? ?Exam:   ?There were no vitals taken for this visit.    ?General appearance: alert, cooperative and appears stated age ?Head: normocephalic, without obvious abnormality, atraumatic ?Neck: no adenopathy, supple, symmetrical, trachea midline and thyroid normal to inspection and palpation ?Lungs: clear to auscultation bilaterally ?Breasts: normal appearance, no masses or tenderness, No nipple retraction or dimpling, No nipple discharge or bleeding, No axillary adenopathy ?Heart: regular rate and rhythm ?Abdomen: soft, non-tender; no masses, no organomegaly ?Extremities: extremities normal, atraumatic, no cyanosis or edema ?Skin: skin color, texture, turgor normal. No rashes or lesions ?Lymph nodes: cervical, supraclavicular, and axillary nodes normal. ?Neurologic: grossly normal ? ?Pelvic: External genitalia:  no lesions ?             No abnormal inguinal nodes palpated. ?             Urethra:  normal appearing urethra with no masses, tenderness or lesions ?             Bartholins and Skenes: normal    ?             Vagina: normal appearing vagina with normal color and discharge, no lesions ?             Cervix: no lesions ?             Pap taken: {yes no:314532} ?Bimanual Exam:  Uterus:  normal size, contour, position, consistency, mobility, non-tender ?             Adnexa: no mass, fullness, tenderness ?             Rectal exam: {yes no:314532}.  Confirms. ?             Anus:  normal sphincter tone, no lesions ? ?Chaperone was present for exam:  *** ? ?Assessment:   ?Well woman visit with gynecologic exam. ? ? ?Plan: ?Mammogram screening  discussed. ?Self breast awareness reviewed. ?Pap and HR HPV as above. ?Guidelines for Calcium, Vitamin D, regular exercise program including cardiovascular and weight bearing exercise. ?  ?Follow up annually and prn.  ? ?Additional counseling given.  {  yes no:314532}. ?_______ minutes face to face time of which over 50% was spent in counseling.  ? ? ?After visit summary provided.  ? ? ? ?

## 2021-11-25 ENCOUNTER — Other Ambulatory Visit: Payer: Self-pay | Admitting: Obstetrics and Gynecology

## 2021-11-26 ENCOUNTER — Ambulatory Visit: Payer: 59 | Admitting: Obstetrics and Gynecology

## 2021-12-07 ENCOUNTER — Other Ambulatory Visit: Payer: Self-pay | Admitting: Obstetrics and Gynecology

## 2021-12-08 NOTE — Telephone Encounter (Signed)
Last AEX 11/19/20--scheduled for 12/16/21.  ?

## 2021-12-11 NOTE — Progress Notes (Signed)
51 y.o. G0P0000 Single Caucasian female here for annual exam.   ? ?Declines STD screening.  ? ?Doing well on Valtrex 500 mg daily.  ?Wants to continue.  ?No outbreaks.  ? ?MS is under control.  ? ?Gala Murdoch is working well for her bladder symptoms.  ? ?PCP:   [facility]  ? ?Patient's last menstrual period was 11/22/2021 (exact date).     ?Period Cycle (Days): 30 ?Period Duration (Days): 6 ?Period Pattern: Regular ?Menstrual Flow:  (first 2 days heavy then tapers) ?Menstrual Control: Tampon, Maxi pad ?Menstrual Control Change Freq (Hours): changes tampon every 6-8 hours on heaviest day ?Dysmenorrhea: None ?    ?Sexually active: Yes.    ?The current method of family planning is none.    ?Exercising: No.  The patient does not participate in regular exercise at present. ?Smoker:  no ? ?Health Maintenance: ?Pap:  09-21-18 Neg:Neg HR HPV, 06-29-17 Neg:Neg HR HPV, 06-16-16 ASCUS:Pos HR HPV ?History of abnormal Pap:  yes, 08-10-16 colpo revealed LGSIL of cervix and vagina;06-16-16 ASCUS:Pos HR HPV ?MMG:  07-08-21 Neg/Birads1 ?Colonoscopy:  10-30-20 polyps;next 2025 ?BMD: 2013  Result :Normal ?TDaP:  06-29-17 ?Gardasil:   yes, completed ?HIV: 11-19-20 NR ?Hep C: 11-19-20 Neg ?Screening Labs:  PCP. ? ? reports that she has never smoked. She has never used smokeless tobacco. She reports that she does not drink alcohol and does not use drugs. ? ?Past Medical History:  ?Diagnosis Date  ? 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 LGSIL of cervix and VAIN I noted at colposcopy.   ? Colon polyps   ? Contact lens/glasses fitting   ? Depression   ? Fatigue   ? Gallbladder & bile duct stone, nonacute cholecystitis   ? H/O renal calculi 7/10  & 5/12  ? X 2  ? Hernia   ? History of colon polyps   ? History of colon polyps   ? 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 Hyperlipidemia   ? Multiple sclerosis (HCC) 12-23-11  ? dx.12'09-Recent tx. Tysabri(last IV tx. 09-21-11), only Ritalin now  ? Neuromuscular disorder (HCC)   ? MS   ?  Peripheral vascular disease (HCC)   ? varicose veins, hx of superficial thrombus in leg.  ? ? ?Past Surgical History:  ?Procedure Laterality Date  ? banding removed    ? BREATH TEK H PYLORI  10/23/2011  ? Procedure: BREATH TEK H PYLORI;  Surgeon: Valarie Merino, MD;  Location: [facility];  Service: General;  Laterality: N/A;  ? CHOLECYSTECTOMY  06/14/2012  ? Procedure: LAPAROSCOPIC CHOLECYSTECTOMY WITH INTRAOPERATIVE CHOLANGIOGRAM;  Surgeon: Valarie Merino, MD;  Location: [facility];  Service: General;  Laterality: N/A;  ? COLONOSCOPY    ? ESOPHAGOGASTRODUODENOSCOPY  05/12/2012  ? Procedure: ESOPHAGOGASTRODUODENOSCOPY (EGD);  Surgeon: Kandis Cocking, MD;  Location: [facility];  Service: General;  Laterality: N/A;  ? HERNIA REPAIR  02/1995  ? inguinal  ? LAPAROSCOPIC GASTRIC BANDING  12/29/2011  ? Procedure: LAPAROSCOPIC GASTRIC BANDING;  Surgeon: Valarie Merino, MD;  Location: [facility];  Service: General;  Laterality: N/A;  ? ? ?Current Outpatient Medications  ?Medication Sig Dispense Refill  ? amphetamine-dextroamphetamine (ADDERALL) 20 MG tablet Take 20 mg 2 (two) times daily by mouth.  0  ? baclofen (LIORESAL) 10 MG tablet Take 10 mg by mouth 3 (three) times daily.    ? busPIRone (BUSPAR) 15 MG tablet Take 15 mg by mouth daily.    ? Cholecalciferol (VITAMIN D3) 1.25 MG (50000  UT) CAPS Take 1 capsule by mouth once a week.    ? DiphenhydrAMINE HCl (BENADRYL PO) Take by mouth as needed.    ? doxycycline (PERIOSTAT) 20 MG tablet Take 20 mg by mouth 2 (two) times daily.    ? doxycycline (PERIOSTAT) 20 MG tablet Take 1 tablet by mouth 2 (two) times daily.    ? DULoxetine (CYMBALTA) 60 MG capsule Take 1 capsule by mouth daily.    ? gabapentin (NEURONTIN) 300 MG capsule Take 300 mg by mouth.    ? gabapentin (NEURONTIN) 600 MG tablet Take 600 mg by mouth 3 (three) times daily.    ? hydroquinone 4 % cream Apply topically.    ? primidone (MYSOLINE) 50 MG tablet Take 50 mg by mouth 2 (two) times daily.    ? propranolol  (INDERAL) 20 MG tablet Take 20 mg by mouth 2 (two) times daily.    ? riTUXimab-pvvr (RUXIENCE IV) Inject into the vein.    ? rosuvastatin (CRESTOR) 10 MG tablet 1 tablet    ? temazepam (RESTORIL) 15 MG capsule Take 1 capsule by mouth as needed.    ? TOVIAZ 4 MG TB24 tablet Take 1 tablet by mouth 2 (two) times daily.    ? tretinoin (RETIN-A) 0.05 % cream 1 application to affected area in the evening to face    ? valACYclovir (VALTREX) 500 MG tablet TAKE 1 TABLET BY MOUTH DAILY 90 tablet 0  ? ?No current facility-administered medications for this visit.  ? ? ?Family History  ?Problem Relation Age of Onset  ? Hyperlipidemia Mother   ? Hypertension Mother   ? Hypothyroidism Mother   ? Cancer Mother   ?     pituitary  ? Alzheimer's disease Mother   ? Hyperlipidemia Father   ? Hypertension Father   ? Heart disease Father   ? Heart disease Brother 50  ?     stent placement  ? ? ?Review of Systems  ?All other systems reviewed and are negative. ? ?Exam:   ?BP 110/64   Pulse 82   Ht 5\' 9"  (1.753 m)   Wt 139 lb (63 kg)   LMP 11/22/2021 (Exact Date)   SpO2 94%   BMI 20.53 kg/m?     ?General appearance: alert, cooperative and appears stated age ?Head: normocephalic, without obvious abnormality, atraumatic ?Neck: no adenopathy, supple, symmetrical, trachea midline and thyroid normal to inspection and palpation ?Lungs: clear to auscultation bilaterally ?Breasts: normal appearance, no masses or tenderness, No nipple retraction or dimpling, No nipple discharge or bleeding, No axillary adenopathy ?Heart: regular rate and rhythm ?Abdomen: soft, non-tender; no masses, no organomegaly ?Extremities: extremities normal, atraumatic, no cyanosis or edema ?Skin: skin color, texture, turgor normal. No rashes or lesions ?Lymph nodes: cervical, supraclavicular, and axillary nodes normal. ?Neurologic: grossly normal ? ?Pelvic: External genitalia:  no lesions ?             No abnormal inguinal nodes palpated. ?             Urethra:  normal  appearing urethra with no masses, tenderness or lesions ?             Bartholins and Skenes: normal    ?             Vagina: normal appearing vagina with normal color and discharge, no lesions ?             Cervix: no lesions ?  Pap taken: yes ?Bimanual Exam:  Uterus:  normal size, contour, position, consistency, mobility, non-tender ?             Adnexa: no mass, fullness, tenderness ?             Rectal exam: yes.  Confirms. ?             Anus:  normal sphincter tone, no lesions ? ?Chaperone was present for exam:  Joy, CMA ? ?Assessment:   ?Well woman visit with gynecologic exam. ?MS.  ?Positive for JC Virus.  ?Hx LGSIL.  ?Urinary frequency.  On Toviaz.  ?HSV I and II.  ?Hx superficial thrombus of leg. ? ?Plan: ?Mammogram screening discussed. ?Self breast awareness reviewed. ?Pap and HR HPV collected. ?Guidelines for Calcium, Vitamin D, regular exercise program including cardiovascular and weight bearing exercise. ?Refill of Valtrex 500 mg po daily for HSV prevention and then bid x 3 days as needed for an outbreak. #110, RF 3.  ?Follow up annually and prn.  ? ?After visit summary provided.  ? ? ? ?

## 2021-12-16 ENCOUNTER — Ambulatory Visit (INDEPENDENT_AMBULATORY_CARE_PROVIDER_SITE_OTHER): Payer: 59 | Admitting: Obstetrics and Gynecology

## 2021-12-16 ENCOUNTER — Encounter: Payer: Self-pay | Admitting: Obstetrics and Gynecology

## 2021-12-16 ENCOUNTER — Other Ambulatory Visit (HOSPITAL_COMMUNITY)
Admission: RE | Admit: 2021-12-16 | Discharge: 2021-12-16 | Disposition: A | Discharge status: Home / Self Care | Payer: 59 | Source: Ambulatory Visit | Attending: Obstetrics and Gynecology | Admitting: Obstetrics and Gynecology

## 2021-12-16 VITALS — BP 110/64 | HR 82 | Ht 69.0 in | Wt 139.0 lb

## 2021-12-16 DIAGNOSIS — Z124 Encounter for screening for malignant neoplasm of cervix: Secondary | ICD-10-CM

## 2021-12-16 DIAGNOSIS — Z01419 Encounter for gynecological examination (general) (routine) without abnormal findings: Secondary | ICD-10-CM

## 2021-12-16 MED ORDER — VALACYCLOVIR HCL 500 MG PO TABS: 500.0000 mg | ORAL_TABLET | Freq: Every day | ORAL | 3 refills | Status: DC

## 2021-12-16 NOTE — Patient Instructions (Signed)
EXERCISE AND DIET:  We recommended that you start or continue a regular exercise program for good health. Regular exercise means any activity that makes your heart beat faster and makes you sweat.  We recommend exercising at least 30 minutes per day at least 3 days a week, preferably 4 or 5.  We also recommend a diet low in fat and sugar.  Inactivity, poor dietary choices and obesity can cause diabetes, heart attack, stroke, and kidney damage, among others.   ? ?ALCOHOL AND SMOKING:  Women should limit their alcohol intake to no more than 7 drinks/beers/glasses of wine (combined, not each!) per week. Moderation of alcohol intake to this level decreases your risk of breast cancer and liver damage. And of course, no recreational drugs are part of a healthy lifestyle.  And absolutely no smoking or even second hand smoke. Most people know smoking can cause heart and lung diseases, but did you know it also contributes to weakening of your bones? Aging of your skin?  Yellowing of your teeth and nails? ? ?CALCIUM AND VITAMIN D:  Adequate intake of calcium and Vitamin D are recommended.  The recommendations for exact amounts of these supplements seem to change often, but generally speaking 600 mg of calcium (either carbonate or citrate) and 800 units of Vitamin D per day seems prudent. Certain women may benefit from higher intake of Vitamin D.  If you are among these women, your doctor will have told you during your visit.   ? ?PAP SMEARS:  Pap smears, to check for cervical cancer or precancers,  have traditionally been done yearly, although recent scientific advances have shown that most women can have pap smears less often.  However, every woman still should have a physical exam from her gynecologist every year. It will include a breast check, inspection of the vulva and vagina to check for abnormal growths or skin changes, a visual exam of the cervix, and then an exam to evaluate the size and shape of the uterus and  ovaries.  And after 49 years of age, a rectal exam is indicated to check for rectal cancers. We will also provide age appropriate advice regarding health maintenance, like when you should have certain vaccines, screening for sexually transmitted diseases, bone density testing, colonoscopy, mammograms, etc.  ? ?MAMMOGRAMS:  All women over 40 years old should have a yearly mammogram. Many facilities now offer a "3D" mammogram, which may cost around $50 extra out of pocket. If possible,  we recommend you accept the option to have the 3D mammogram performed.  It both reduces the number of women who will be called back for extra views which then turn out to be normal, and it is better than the routine mammogram at detecting truly abnormal areas.   ? ?COLONOSCOPY:  Colonoscopy to screen for colon cancer is recommended for all women at age 50.  We know, you hate the idea of the prep.  We agree, BUT, having colon cancer and not knowing it is worse!!  Colon cancer so often starts as a polyp that can be seen and removed at colonscopy, which can quite literally save your life!  And if your first colonoscopy is normal and you have no family history of colon cancer, most women don't have to have it again for 10 years.  Once every ten years, you can do something that may end up saving your life, right?  We will be happy to help you get it scheduled when you are ready.    Be sure to check your insurance coverage so you understand how much it will cost.  It may be covered as a preventative service at no cost, but you should check your particular policy.   ? ?Calcium Content in Foods ?Calcium is the most abundant mineral in the body. Most of the body's calcium supply is stored in bones and teeth. Calcium helps many parts of the body function normally, including: ?Blood and blood vessels. ?Nerves. ?Hormones. ?Muscles. ?Bones and teeth. ?When your calcium stores are low, you may be at risk for low bone mass, bone loss, and broken bones  (fractures). When you get enough calcium, it helps to support strong bones and teeth throughout your life. ?Calcium is especially important for: ?Children during growth spurts. ?Girls during adolescence. ?Women who are pregnant or breastfeeding. ?Women after their menstrual cycle stops (postmenopause). ?Women whose menstrual cycle has stopped due to anorexia nervosa or regular intense exercise. ?People who cannot eat or digest dairy products. ?Vegans. ?Recommended daily amounts of calcium: ?Women (ages 19 to 50): 1,000 mg per day. ?Women (ages 51 and older): 1,200 mg per day. ?Men (ages 19 to 70): 1,000 mg per day. ?Men (ages 71 and older): 1,200 mg per day. ?Women (ages 9 to 18): 1,300 mg per day. ?Men (ages 9 to 18): 1,300 mg per day. ?General information ?Eat foods that are high in calcium. Try to get most of your calcium from food. ?Some people may benefit from taking calcium supplements. Check with your health care provider or diet and nutrition specialist (dietitian) before starting any calcium supplements. Calcium supplements may interact with certain medicines. Too much calcium may cause other health problems, such as constipation and kidney stones. ?For the body to absorb calcium, it needs vitamin D. Sources of vitamin D include: ?Skin exposure to direct sunlight. ?Foods, such as egg yolks, liver, mushrooms, saltwater fish, and fortified milk. ?Vitamin D supplements. Check with your health care provider or dietitian before starting any vitamin D supplements. ?What foods are high in calcium? ? ?Foods that are high in calcium contain more than 100 milligrams per serving. ?Fruits ?Fortified orange juice or other fruit juice, 300 mg per 8 oz serving. ?Vegetables ?Collard greens, 360 mg per 8 oz serving. ?Kale, 100 mg per 8 oz serving. ?Bok choy, 160 mg per 8 oz serving. ?Grains ?Fortified ready-to-eat cereals, 100 to 1,000 mg per 8 oz serving. ?Fortified frozen waffles, 200 mg in 2 waffles. ?Oatmeal, 140 mg in  1 cup. ?Meats and other proteins ?Sardines, canned with bones, 325 mg per 3 oz serving. ?Salmon, canned with bones, 180 mg per 3 oz serving. ?Canned shrimp, 125 mg per 3 oz serving. ?Baked beans, 160 mg per 4 oz serving. ?Tofu, firm, made with calcium sulfate, 253 mg per 4 oz serving. ?Dairy ?Yogurt, plain, low-fat, 310 mg per 6 oz serving. ?Nonfat milk, 300 mg per 8 oz serving. ?American cheese, 195 mg per 1 oz serving. ?Cheddar cheese, 205 mg per 1 oz serving. ?Cottage cheese 2%, 105 mg per 4 oz serving. ?Fortified soy, rice, or almond milk, 300 mg per 8 oz serving. ?Mozzarella, part skim, 210 mg per 1 oz serving. ?The items listed above may not be a complete list of foods high in calcium. Actual amounts of calcium may be different depending on processing. Contact a dietitian for more information. ?What foods are lower in calcium? ?Foods that are lower in calcium contain 50 mg or less per serving. ?Fruits ?Apple, about 6 mg. ?Banana, about 12 mg. ?  Vegetables ?Lettuce, 19 mg per 2 oz serving. ?Tomato, about 11 mg. ?Grains ?Rice, 4 mg per 6 oz serving. ?Boiled potatoes, 14 mg per 8 oz serving. ?White bread, 6 mg per slice. ?Meats and other proteins ?Egg, 27 mg per 2 oz serving. ?Red meat, 7 mg per 4 oz serving. ?Chicken, 17 mg per 4 oz serving. ?Fish, cod, or trout, 20 mg per 4 oz serving. ?Dairy ?Cream cheese, regular, 14 mg per 1 Tbsp serving. ?Brie cheese, 50 mg per 1 oz serving. ?Parmesan cheese, 70 mg per 1 Tbsp serving. ?The items listed above may not be a complete list of foods lower in calcium. Actual amounts of calcium may be different depending on processing. Contact a dietitian for more information. ?Summary ?Calcium is an important mineral in the body because it affects many functions. Getting enough calcium helps support strong bones and teeth throughout your life. ?Try to get most of your calcium from food. ?Calcium supplements may interact with certain medicines. Check with your health care provider  or dietitian before starting any calcium supplements. ?This information is not intended to replace advice given to you by your health care provider. Make sure you discuss any questions you have with your h

## 2021-12-17 ENCOUNTER — Other Ambulatory Visit: Payer: Self-pay

## 2021-12-17 DIAGNOSIS — B009 Herpesviral infection, unspecified: Secondary | ICD-10-CM

## 2021-12-17 MED ORDER — VALACYCLOVIR HCL 500 MG PO TABS: 500.0000 mg | ORAL_TABLET | Freq: Every day | ORAL | 3 refills | Status: DC

## 2021-12-17 NOTE — Telephone Encounter (Signed)
Inquiry received from [facility] re: sig of valtrex. They are inquiring if just taking preventatively, just for an outbreak, or for both. Will confirm Rx sent for pt to have enough for both prn.  ?

## 2021-12-18 LAB — CYTOLOGY - PAP
Comment: NEGATIVE
Diagnosis: NEGATIVE
High risk HPV: NEGATIVE

## 2022-10-14 ENCOUNTER — Other Ambulatory Visit: Payer: Self-pay | Admitting: Obstetrics and Gynecology

## 2022-10-14 DIAGNOSIS — B009 Herpesviral infection, unspecified: Secondary | ICD-10-CM

## 2022-10-15 NOTE — Telephone Encounter (Signed)
AEX 12/16/21 AEX scheduled 01/25/23

## 2023-01-11 NOTE — Progress Notes (Deleted)
52 y.o. G0P0000 Single Caucasian female here for annual exam.    PCP:     No LMP recorded.           Sexually active: {yes no:314532}  The current method of family planning is none.    Exercising: {yes no:314532}  {types:19826} Smoker:  no  Health Maintenance: Pap:  09-21-18 Neg:Neg HR HPV, 06-29-17 Neg:Neg HR HPV, 06-16-16 ASCUS:Pos HR HPV  History of abnormal Pap:  yes, 08-10-16 colpo revealed LGSIL of cervix and vagina;06-16-16 ASCUS:Pos HR HPV  MMG:  07/08/21 Breast Density Cat C, BI-RADS CAT 1 neg Colonoscopy:  10/30/20 polyps, next 2025 BMD:   2013  Result  normal TDaP:  06/29/17  Gardasil:   yes, completed HIV: 11/19/20 NR Hep C: 11/19/20 neg Screening Labs:  Hb today: ***, Urine today: ***   reports that she has never smoked. She has never used smokeless tobacco. She reports that she does not drink alcohol and does not use drugs.  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    Colon polyps    Contact lens/glasses fitting    Depression    Fatigue    Gallbladder & bile duct stone, nonacute cholecystitis    H/O renal calculi 7/10  & 5/12   X 2   Hernia    History of colon polyps    History of colon polyps    HSV-1 infection    HSV-2 infection 2018   Hyperlipidemia    Multiple sclerosis (HCC) 12-23-11   dx.12'09-Recent tx. Tysabri(last IV tx. 09-21-11), only Ritalin now   Neuromuscular disorder (HCC)    MS    Peripheral vascular disease (HCC)    varicose veins, hx of superficial thrombus in leg.    Past Surgical History:  Procedure Laterality Date   banding removed     BREATH TEK H PYLORI  10/23/2011   Procedure: BREATH TEK H PYLORI;  Surgeon: Valarie Merino, MD;  Location: [facility];  Service: General;  Laterality: N/A;   CHOLECYSTECTOMY  06/14/2012   Procedure: LAPAROSCOPIC CHOLECYSTECTOMY WITH INTRAOPERATIVE CHOLANGIOGRAM;  Surgeon: Valarie Merino, MD;  Location: [facility];   Service: General;  Laterality: N/A;   COLONOSCOPY     ESOPHAGOGASTRODUODENOSCOPY  05/12/2012   Procedure: ESOPHAGOGASTRODUODENOSCOPY (EGD);  Surgeon: Kandis Cocking, MD;  Location: [facility];  Service: General;  Laterality: N/A;   HERNIA REPAIR  02/1995   inguinal   LAPAROSCOPIC GASTRIC BANDING  12/29/2011   Procedure: LAPAROSCOPIC GASTRIC BANDING;  Surgeon: Valarie Merino, MD;  Location: [facility];  Service: General;  Laterality: N/A;    Current Outpatient Medications  Medication Sig Dispense Refill   amphetamine-dextroamphetamine (ADDERALL) 20 MG tablet Take 20 mg 2 (two) times daily by mouth.  0   baclofen (LIORESAL) 10 MG tablet Take 10 mg by mouth 3 (three) times daily.     busPIRone (BUSPAR) 15 MG tablet Take 15 mg by mouth daily.     Cholecalciferol (VITAMIN D3) 1.25 MG (50000 UT) CAPS Take 1 capsule by mouth once a week.     DiphenhydrAMINE HCl (BENADRYL PO) Take by mouth as needed.     doxycycline (PERIOSTAT) 20 MG tablet Take 20 mg by mouth 2 (two) times daily.     doxycycline (PERIOSTAT) 20 MG tablet Take 1 tablet by mouth 2 (two) times daily.     DULoxetine (CYMBALTA) 60 MG capsule Take 1 capsule by mouth  daily.     gabapentin (NEURONTIN) 300 MG capsule Take 300 mg by mouth.     gabapentin (NEURONTIN) 600 MG tablet Take 600 mg by mouth 3 (three) times daily.     hydroquinone 4 % cream Apply topically.     primidone (MYSOLINE) 50 MG tablet Take 50 mg by mouth 2 (two) times daily.     propranolol (INDERAL) 20 MG tablet Take 20 mg by mouth 2 (two) times daily.     riTUXimab-pvvr (RUXIENCE IV) Inject into the vein.     rosuvastatin (CRESTOR) 10 MG tablet 1 tablet     temazepam (RESTORIL) 15 MG capsule Take 1 capsule by mouth as needed.     TOVIAZ 4 MG TB24 tablet Take 1 tablet by mouth 2 (two) times daily.     tretinoin (RETIN-A) 0.05 % cream 1 application to affected area in the evening to face     valACYclovir (VALTREX) 500 MG tablet TAKE 1 TABLET BY MOUTH DAILY  TAKE 1 TABLET  BY MOUTH TWICE  DAILY FOR 3 DAYS FOR AN OUTBREAK AS NEEDED 110 tablet 1   No current facility-administered medications for this visit.    Family History  Problem Relation Age of Onset   Hyperlipidemia Mother    Hypertension Mother    Hypothyroidism Mother    Cancer Mother        pituitary   Alzheimer's disease Mother    Hyperlipidemia Father    Hypertension Father    Heart disease Father    Heart disease Brother 66       stent placement    Review of Systems  Exam:   There were no vitals taken for this visit.    General appearance: alert, cooperative and appears stated age Head: normocephalic, without obvious abnormality, atraumatic Neck: no adenopathy, supple, symmetrical, trachea midline and thyroid normal to inspection and palpation Lungs: clear to auscultation bilaterally Breasts: normal appearance, no masses or tenderness, No nipple retraction or dimpling, No nipple discharge or bleeding, No axillary adenopathy Heart: regular rate and rhythm Abdomen: soft, non-tender; no masses, no organomegaly Extremities: extremities normal, atraumatic, no cyanosis or edema Skin: skin color, texture, turgor normal. No rashes or lesions Lymph nodes: cervical, supraclavicular, and axillary nodes normal. Neurologic: grossly normal  Pelvic: External genitalia:  no lesions              No abnormal inguinal nodes palpated.              Urethra:  normal appearing urethra with no masses, tenderness or lesions              Bartholins and Skenes: normal                 Vagina: normal appearing vagina with normal color and discharge, no lesions              Cervix: no lesions              Pap taken: {yes no:314532} Bimanual Exam:  Uterus:  normal size, contour, position, consistency, mobility, non-tender              Adnexa: no mass, fullness, tenderness              Rectal exam: {yes no:314532}.  Confirms.              Anus:  normal sphincter tone, no lesions  Chaperone was present for  exam:  ***  Assessment:   Well woman visit with  gynecologic exam.   Plan: Mammogram screening discussed. Self breast awareness reviewed. Pap and HR HPV as above. Guidelines for Calcium, Vitamin D, regular exercise program including cardiovascular and weight bearing exercise.   Follow up annually and prn.   Additional counseling given.  {yes [id number]. _______ minutes face to face time of which over 50% was spent in counseling.    After visit summary provided.

## 2023-01-25 ENCOUNTER — Ambulatory Visit: Payer: 59 | Admitting: Obstetrics and Gynecology

## 2023-03-29 ENCOUNTER — Other Ambulatory Visit: Payer: Self-pay | Admitting: Obstetrics and Gynecology

## 2023-03-29 DIAGNOSIS — B009 Herpesviral infection, unspecified: Secondary | ICD-10-CM

## 2023-03-30 NOTE — Telephone Encounter (Signed)
Medication refill request: valtrex 500mg  Last AEX:  12-16-21 Next AEX: 05-13-23 Last MMG (if hormonal medication request): 08-28-22 birads 1:neg (care everywhere) Refill authorized: please approve if appropriate

## 2023-04-29 NOTE — Progress Notes (Deleted)
52 y.o. G0P0000 Single Caucasian female here for annual exam.    PCP:     No LMP recorded.           Sexually active: {yes no:314532}  The current method of family planning is {contraception:315051}.    Exercising: {yes no:314532}  {types:19826} Smoker:  no  Health Maintenance: Pap: 12/16/21 neg: HR HPV neg,  09-21-18 Neg:Neg HR HPV, 06-29-17 Neg:Neg HR HPV History of abnormal Pap:  yes, 08-10-16 colpo revealed LGSIL of cervix and vagina;06-16-16 ASCUS:Pos HR HPV  MMG:  07/08/21 breast Density Cat C, BI-RADS CAT 1 neg Colonoscopy:  10/30/20 polyps BMD:   2013  Result  WNL TDaP:  06/29/17 Gardasil:   yes HIV: 11/19/20 NR Hep C: 11/19/20 neg Screening Labs:  Hb today: ***, Urine today: ***   reports that she has never smoked. She has never used smokeless tobacco. She reports that she does not drink alcohol and does not use drugs.  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    Colon polyps    Contact lens/glasses fitting    Depression    Fatigue    Gallbladder & bile duct stone, nonacute cholecystitis    H/O renal calculi 7/10  & 5/12   X 2   Hernia    History of colon polyps    History of colon polyps    HSV-1 infection    HSV-2 infection 2018   Hyperlipidemia    Multiple sclerosis (HCC) 12-23-11   dx.12'09-Recent tx. Tysabri(last IV tx. 09-21-11), only Ritalin now   Neuromuscular disorder (HCC)    MS    Peripheral vascular disease (HCC)    varicose veins, hx of superficial thrombus in leg.    Past Surgical History:  Procedure Laterality Date   banding removed     BREATH TEK H PYLORI  10/23/2011   Procedure: BREATH TEK H PYLORI;  Surgeon: Valarie Merino, MD;  Location: [facility];  Service: General;  Laterality: N/A;   CHOLECYSTECTOMY  06/14/2012   Procedure: LAPAROSCOPIC CHOLECYSTECTOMY WITH INTRAOPERATIVE CHOLANGIOGRAM;  Surgeon: Valarie Merino, MD;  Location: [facility];  Service:  General;  Laterality: N/A;   COLONOSCOPY     ESOPHAGOGASTRODUODENOSCOPY  05/12/2012   Procedure: ESOPHAGOGASTRODUODENOSCOPY (EGD);  Surgeon: Kandis Cocking, MD;  Location: [facility];  Service: General;  Laterality: N/A;   HERNIA REPAIR  02/1995   inguinal   LAPAROSCOPIC GASTRIC BANDING  12/29/2011   Procedure: LAPAROSCOPIC GASTRIC BANDING;  Surgeon: Valarie Merino, MD;  Location: [facility];  Service: General;  Laterality: N/A;    Current Outpatient Medications  Medication Sig Dispense Refill   amphetamine-dextroamphetamine (ADDERALL) 20 MG tablet Take 20 mg 2 (two) times daily by mouth.  0   baclofen (LIORESAL) 10 MG tablet Take 10 mg by mouth 3 (three) times daily.     busPIRone (BUSPAR) 15 MG tablet Take 15 mg by mouth daily.     Cholecalciferol (VITAMIN D3) 1.25 MG (50000 UT) CAPS Take 1 capsule by mouth once a week.     DiphenhydrAMINE HCl (BENADRYL PO) Take by mouth as needed.     doxycycline (PERIOSTAT) 20 MG tablet Take 20 mg by mouth 2 (two) times daily.     doxycycline (PERIOSTAT) 20 MG tablet Take 1 tablet by mouth 2 (two) times daily.     DULoxetine (CYMBALTA) 60 MG capsule Take 1 capsule by mouth daily.  gabapentin (NEURONTIN) 300 MG capsule Take 300 mg by mouth.     gabapentin (NEURONTIN) 600 MG tablet Take 600 mg by mouth 3 (three) times daily.     hydroquinone 4 % cream Apply topically.     primidone (MYSOLINE) 50 MG tablet Take 50 mg by mouth 2 (two) times daily.     propranolol (INDERAL) 20 MG tablet Take 20 mg by mouth 2 (two) times daily.     riTUXimab-pvvr (RUXIENCE IV) Inject into the vein.     rosuvastatin (CRESTOR) 10 MG tablet 1 tablet     temazepam (RESTORIL) 15 MG capsule Take 1 capsule by mouth as needed.     TOVIAZ 4 MG TB24 tablet Take 1 tablet by mouth 2 (two) times daily.     tretinoin (RETIN-A) 0.05 % cream 1 application to affected area in the evening to face     valACYclovir (VALTREX) 500 MG tablet TAKE 1 TABLET BY MOUTH DAILY,  TAKE 1 TABLET BY MOUTH  TWICE  DAILY FOR 3 DAYS FOR AN OUTBREAK AS NEEDED 110 tablet 0   No current facility-administered medications for this visit.    Family History  Problem Relation Age of Onset   Hyperlipidemia Mother    Hypertension Mother    Hypothyroidism Mother    Cancer Mother        pituitary   Alzheimer's disease Mother    Hyperlipidemia Father    Hypertension Father    Heart disease Father    Heart disease Brother 87       stent placement    Review of Systems  Exam:   There were no vitals taken for this visit.    General appearance: alert, cooperative and appears stated age Head: normocephalic, without obvious abnormality, atraumatic Neck: no adenopathy, supple, symmetrical, trachea midline and thyroid normal to inspection and palpation Lungs: clear to auscultation bilaterally Breasts: normal appearance, no masses or tenderness, No nipple retraction or dimpling, No nipple discharge or bleeding, No axillary adenopathy Heart: regular rate and rhythm Abdomen: soft, non-tender; no masses, no organomegaly Extremities: extremities normal, atraumatic, no cyanosis or edema Skin: skin color, texture, turgor normal. No rashes or lesions Lymph nodes: cervical, supraclavicular, and axillary nodes normal. Neurologic: grossly normal  Pelvic: External genitalia:  no lesions              No abnormal inguinal nodes palpated.              Urethra:  normal appearing urethra with no masses, tenderness or lesions              Bartholins and Skenes: normal                 Vagina: normal appearing vagina with normal color and discharge, no lesions              Cervix: no lesions              Pap taken: {yes no:314532} Bimanual Exam:  Uterus:  normal size, contour, position, consistency, mobility, non-tender              Adnexa: no mass, fullness, tenderness              Rectal exam: {yes no:314532}.  Confirms.              Anus:  normal sphincter tone, no lesions  Chaperone was present for exam:   ***  Assessment:   Well woman visit with gynecologic exam.   Plan:  Mammogram screening discussed. Self breast awareness reviewed. Pap and HR HPV as above. Guidelines for Calcium, Vitamin D, regular exercise program including cardiovascular and weight bearing exercise.   Follow up annually and prn.   Additional counseling given.  {yes [id number]. _______ minutes face to face time of which over 50% was spent in counseling.    After visit summary provided.

## 2023-05-13 ENCOUNTER — Ambulatory Visit: Payer: 59 | Admitting: Obstetrics and Gynecology

## 2023-06-05 ENCOUNTER — Other Ambulatory Visit: Payer: Self-pay | Admitting: Obstetrics and Gynecology

## 2023-06-05 DIAGNOSIS — B009 Herpesviral infection, unspecified: Secondary | ICD-10-CM

## 2023-06-07 NOTE — Telephone Encounter (Signed)
Medication refill request: valtrex 500mg  Last AEX:  12-14-21 Next AEX: 09-20-23 Last MMG (if hormonal medication request): n/a Refill authorized: please approve if appropriate

## 2023-08-12 ENCOUNTER — Other Ambulatory Visit: Payer: Self-pay | Admitting: Obstetrics and Gynecology

## 2023-08-12 DIAGNOSIS — B009 Herpesviral infection, unspecified: Secondary | ICD-10-CM

## 2023-08-12 NOTE — Telephone Encounter (Signed)
Medication refill request: valtrex 500mg  Last AEX:  12-14-21 Next AEX: 09-20-23 Last MMG (if hormonal medication request): n/a Refill authorized: please approve if appropriate

## 2023-09-06 NOTE — Progress Notes (Signed)
53 y.o. G0P0000 Single Caucasian female here for annual exam.    Menses are skipping.   Had 6 - 7 menses in the last 12 years.   Declines pregnancy prevention.   Declines STD screening.   Wants to continue Valtrex daily.  Dr. Gladstone Pih from neurology is prescribing Prozac and Wellbutrin.   He is located in [place].  Counseling has not been helpful in the past. Feels fatigued.   Working a lot of hours.  States she is not suicidal.   PCP: Fabienne Bruns, [facility].    Patient's last menstrual period was 09/17/2023.     Period Duration (Days): 5-6 Period Pattern: (!) Irregular Menstrual Flow: Heavy Menstrual Control: Tampon, Maxi pad Dysmenorrhea: (!) Mild     Sexually active: Yes.    The current method of family planning is n/a.    Menopausal hormone therapy:  n/a Exercising: No.   Smoker:  no  OB History  Gravida Para Term Preterm AB Living  0 0 0 0 0 0  SAB IAB Ectopic Multiple Live Births  0 0 0 0 0     HEALTH MAINTENANCE: Last 2 paps:  12/16/21 neg: HR HPV neg, 09/21/18 neg: HR HPV neg History of abnormal Pap or positive HPV:  yes, 08-10-16 colpo revealed LGSIL of cervix and vagina;06-16-16 ASCUS:Pos HR HPV  Mammogram:   07/08/21 Breast density Cat C, BI-RADS CAT 1 neg. Has mammogram today at [facility] at [facility].  Colonoscopy: 10/30/20 polyps.  Has GI appointment on 10/07/23.  Bone Density:  09/10/11  Result  normal   Immunization History  Administered Date(s) Administered   HPV 9-valent 06/29/2017, 10/01/2017, 09/21/2018   Influenza-Unspecified 05/06/2016   Moderna Covid-19 Vaccine Bivalent Booster 49yrs & up 08/05/2021   Moderna Sars-Covid-2 Vaccination 08/24/2019, 09/21/2019, 04/24/2020   Tdap 06/29/2017      reports that she has never smoked. She has never used smokeless tobacco. She reports that she does not drink alcohol and does not use drugs.  Past Medical History:  Diagnosis Date   Cervical dysplasia, mild 08/10/2016   LGSIL of cervix and VAIN I  noted at colposcopy.    Colon polyps    Contact lens/glasses fitting    Depression    Fatigue    Gallbladder & bile duct stone, nonacute cholecystitis    H/O renal calculi 7/10  & 5/12   X 2   Hernia    History of colon polyps    History of colon polyps    HSV-1 infection    HSV-2 infection 2018   Hyperlipidemia    Multiple sclerosis (HCC) 12-23-11   dx.12'09-Recent tx. Tysabri(last IV tx. 09-21-11), only Ritalin now   Neuromuscular disorder (HCC)    MS    Peripheral vascular disease (HCC)    varicose veins, hx of superficial thrombus in leg.    Past Surgical History:  Procedure Laterality Date   banding removed     BREATH TEK H PYLORI  10/23/2011   Procedure: BREATH TEK H PYLORI;  Surgeon: Valarie Merino, MD;  Location: [facility];  Service: General;  Laterality: N/A;   CHOLECYSTECTOMY  06/14/2012   Procedure: LAPAROSCOPIC CHOLECYSTECTOMY WITH INTRAOPERATIVE CHOLANGIOGRAM;  Surgeon: Valarie Merino, MD;  Location: [facility];  Service: General;  Laterality: N/A;   COLONOSCOPY     ESOPHAGOGASTRODUODENOSCOPY  05/12/2012   Procedure: ESOPHAGOGASTRODUODENOSCOPY (EGD);  Surgeon: Kandis Cocking, MD;  Location: [facility];  Service: General;  Laterality: N/A;   HERNIA REPAIR  02/1995   inguinal  LAPAROSCOPIC GASTRIC BANDING  12/29/2011   Procedure: LAPAROSCOPIC GASTRIC BANDING;  Surgeon: Valarie Merino, MD;  Location: [facility];  Service: General;  Laterality: N/A;    Current Outpatient Medications  Medication Sig Dispense Refill   amphetamine-dextroamphetamine (ADDERALL) 20 MG tablet Take 20 mg 2 (two) times daily by mouth.  0   baclofen (LIORESAL) 10 MG tablet Take 10 mg by mouth 3 (three) times daily.     buPROPion (WELLBUTRIN XL) 300 MG 24 hr tablet Take 300 mg by mouth daily.     busPIRone (BUSPAR) 15 MG tablet Take 15 mg by mouth daily.     Cholecalciferol (VITAMIN D3) 1.25 MG (50000 UT) CAPS Take 1 capsule by mouth once a week.     DiphenhydrAMINE HCl (BENADRYL PO) Take by  mouth as needed.     DULoxetine (CYMBALTA) 60 MG capsule Take 1 capsule by mouth daily.     gabapentin (NEURONTIN) 600 MG tablet Take 600 mg by mouth 3 (three) times daily.     primidone (MYSOLINE) 50 MG tablet Take 50 mg by mouth 2 (two) times daily.     propranolol (INDERAL) 20 MG tablet Take 20 mg by mouth 2 (two) times daily.     riTUXimab-pvvr (RUXIENCE IV) Inject into the vein.     rosuvastatin (CRESTOR) 20 MG tablet Take 20 mg by mouth at bedtime.     temazepam (RESTORIL) 15 MG capsule Take 1 capsule by mouth as needed.     TOVIAZ 4 MG TB24 tablet Take 1 tablet by mouth 2 (two) times daily. (Patient not taking: Reported on 09/20/2023)     valACYclovir (VALTREX) 500 MG tablet Take 1 tablet (500 mg total) by mouth daily. Take daily for prevention.  Take 1 tablet by mouth twice a day for 3 days for an outbreak. 110 tablet 3   No current facility-administered medications for this visit.    ALLERGIES: Tape and Latex  Family History  Problem Relation Age of Onset   Hyperlipidemia Mother    Hypertension Mother    Hypothyroidism Mother    Cancer Mother        pituitary   Alzheimer's disease Mother    Hyperlipidemia Father    Hypertension Father    Heart disease Father    Heart disease Brother 65       stent placement    Review of Systems  All other systems reviewed and are negative.   PHYSICAL EXAM:  BP 124/82 (BP Location: Left Arm, Patient Position: Sitting, Cuff Size: Small)   Pulse 95   Ht 5\' 9"  (1.753 m)   Wt 141 lb (64 kg)   LMP 09/17/2023   SpO2 99%   BMI 20.82 kg/m     General appearance: alert, cooperative and appears stated age Head: normocephalic, without obvious abnormality, atraumatic Neck: no adenopathy, supple, symmetrical, trachea midline and thyroid normal to inspection and palpation Lungs: clear to auscultation bilaterally Breasts: normal appearance, no masses or tenderness, No nipple retraction or dimpling, No nipple discharge or bleeding, No axillary  adenopathy Heart: regular rate and rhythm Abdomen: soft, non-tender; no masses, no organomegaly Extremities: extremities normal, atraumatic, no cyanosis or edema Skin: skin color, texture, turgor normal. No rashes or lesions Lymph nodes: cervical, supraclavicular, and axillary nodes normal. Neurologic: grossly normal  Pelvic: External genitalia:  no lesions              No abnormal inguinal nodes palpated.  Urethra:  normal appearing urethra with no masses, tenderness or lesions              Bartholins and Skenes: normal                 Vagina: normal appearing vagina with normal color and discharge, no lesions              Cervix: no lesions              Pap taken: no Bimanual Exam:  Uterus:  normal size, contour, position, consistency, mobility, non-tender              Adnexa: no mass, fullness, tenderness              Rectal exam: yes.  Confirms.              Anus:  normal sphincter tone, no lesions  Chaperone was present for exam:  Warren Lacy, CMA  ASSESSMENT: Well woman visit with gynecologic exam MS.  Positive for JC Virus.  Hx LGSIL.  HSV I and II.  Hx superficial thrombus of leg. Depression.  Patient is on Wellbutrin and Prozac.  PHQ2 - 14.    PLAN: Mammogram screening discussed. Self breast awareness reviewed. Pap and HRV collected:  no.  Due in 2028.  Guidelines for Calcium, Vitamin D, regular exercise program including cardiovascular and weight bearing exercise. Medication refills:  Valtrex.  Labs with PCP.   I suggested referral from her neurologist to psychiatry for her depression.  I also gave her names of psychiatrists in [place].  Follow up:  yearly and prn.

## 2023-09-20 ENCOUNTER — Ambulatory Visit (INDEPENDENT_AMBULATORY_CARE_PROVIDER_SITE_OTHER): Payer: 59 | Admitting: Obstetrics and Gynecology

## 2023-09-20 ENCOUNTER — Encounter: Payer: Self-pay | Admitting: Obstetrics and Gynecology

## 2023-09-20 DIAGNOSIS — Z01419 Encounter for gynecological examination (general) (routine) without abnormal findings: Secondary | ICD-10-CM

## 2023-09-20 DIAGNOSIS — Z1331 Encounter for screening for depression: Secondary | ICD-10-CM

## 2023-09-20 DIAGNOSIS — B009 Herpesviral infection, unspecified: Secondary | ICD-10-CM

## 2023-09-20 MED ORDER — VALACYCLOVIR HCL 500 MG PO TABS: 500.0000 mg | ORAL_TABLET | Freq: Every day | ORAL | 3 refills | Status: DC

## 2023-09-20 NOTE — Patient Instructions (Signed)

## 2024-09-13 ENCOUNTER — Other Ambulatory Visit: Payer: Self-pay | Admitting: Obstetrics and Gynecology

## 2024-09-13 DIAGNOSIS — B009 Herpesviral infection, unspecified: Secondary | ICD-10-CM

## 2024-09-14 NOTE — Telephone Encounter (Signed)
 Med refill request:   valACYclovir  (VALTREX ) 500 MG tablet  Start:  09/20/23 Disp:  110 tablets Refills:  3  Last AEX:  09/20/23 Next AEX:  09/20/24 Last MMG (if hormonal med):  N/A Refill authorized? Please Advise.

## 2024-09-20 ENCOUNTER — Encounter: Payer: Self-pay | Admitting: Obstetrics and Gynecology

## 2024-09-20 ENCOUNTER — Ambulatory Visit: Payer: 59 | Admitting: Obstetrics and Gynecology

## 2024-09-20 VITALS — BP 112/64 | HR 85 | Ht 69.0 in | Wt 142.0 lb

## 2024-09-20 DIAGNOSIS — B009 Herpesviral infection, unspecified: Secondary | ICD-10-CM

## 2024-09-20 DIAGNOSIS — Z01419 Encounter for gynecological examination (general) (routine) without abnormal findings: Secondary | ICD-10-CM | POA: Diagnosis not present

## 2024-09-20 DIAGNOSIS — Z1331 Encounter for screening for depression: Secondary | ICD-10-CM | POA: Diagnosis not present

## 2024-09-20 MED ORDER — VALACYCLOVIR HCL 500 MG PO TABS: 500.0000 mg | ORAL_TABLET | Freq: Every day | ORAL | 2 refills | Status: AC

## 2024-09-20 NOTE — Progress Notes (Signed)
 "  54 y.o. G0P0000 Single Caucasian female here for annual exam.    3 cycles in the last 12 months.   Some vaginal dryness.     PCP: Pcp, No   Patient's last menstrual period was 07/31/2024 (exact date).           Sexually active: Yes.    The current method of family planning is none.    Menopausal hormone therapy:  n/a Exercising: No.   Smoker:  no  OB History  Gravida Para Term Preterm AB Living  0 0 0 0 0 0  SAB IAB Ectopic Multiple Live Births  0 0 0 0 0     HEALTH MAINTENANCE: Last 2 paps:  12/16/21 neg HR HPV neg, 09/21/18 neg HPV neg  History of abnormal Pap or positive HPV:  yes, 08-10-16 colpo revealed LGSIL of cervix and vagina;06-16-16 ASCUS:Pos HR HPV Mammogram:   09/20/23 BIRADS Cat 1 neg ([facility]).  Has appointment tomorrow.    Colonoscopy:  10/30/20 polyps ([facility]).  July, 2025 - polyp - due in 2030. Bone Density:  09/10/11  Result  normal    Immunization History  Administered Date(s) Administered   HPV 9-valent 06/29/2017, 10/01/2017, 09/21/2018   Influenza-Unspecified 05/06/2016   Moderna Covid-19 Vaccine Bivalent Booster 46yrs & up 08/05/2021   Moderna Sars-Covid-2 Vaccination 08/24/2019, 09/21/2019, 04/24/2020   Tdap 06/29/2017      reports that she has never smoked. She has never used smokeless tobacco. She reports that she does not drink alcohol and does not use drugs.  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    Colon polyps    Contact lens/glasses fitting    Depression    Fatigue    Gallbladder & bile duct stone, nonacute cholecystitis    H/O renal calculi 7/10  & 5/12   X 2   Hernia    History of colon polyps    History of colon polyps    HSV-1 infection    HSV-2 infection 2018   Hyperlipidemia    Multiple sclerosis 12-23-11   dx.12'09-Recent tx. Tysabri(last IV tx. 09-21-11), only Ritalin now   Neuromuscular disorder (HCC)    MS    Peripheral vascular  disease    varicose veins, hx of superficial thrombus in leg.    Past Surgical History:  Procedure Laterality Date   banding removed     BREATH TEK H PYLORI  10/23/2011   Procedure: BREATH TEK H PYLORI;  Surgeon: Donnice KATHEE Lunger, MD;  Location: [facility];  Service: General;  Laterality: N/A;   CHOLECYSTECTOMY  06/14/2012   Procedure: LAPAROSCOPIC CHOLECYSTECTOMY WITH INTRAOPERATIVE CHOLANGIOGRAM;  Surgeon: Donnice KATHEE Lunger, MD;  Location: [facility];  Service: General;  Laterality: N/A;   COLONOSCOPY     ESOPHAGOGASTRODUODENOSCOPY  05/12/2012   Procedure: ESOPHAGOGASTRODUODENOSCOPY (EGD);  Surgeon: Alm VEAR Angle, MD;  Location: [facility];  Service: General;  Laterality: N/A;   HERNIA REPAIR  02/1995   inguinal   LAPAROSCOPIC GASTRIC BANDING  12/29/2011   Procedure: LAPAROSCOPIC GASTRIC BANDING;  Surgeon: Donnice KATHEE Lunger, MD;  Location: [facility];  Service: General;  Laterality: N/A;    Current Outpatient Medications  Medication Sig Dispense Refill   amphetamine-dextroamphetamine (ADDERALL) 20 MG tablet Take 20 mg 2 (two) times daily by mouth.  0   baclofen (LIORESAL) 10 MG tablet Take 10 mg by mouth 3 (three) times daily.  buPROPion (WELLBUTRIN XL) 300 MG 24 hr tablet Take 300 mg by mouth daily.     busPIRone (BUSPAR) 15 MG tablet Take 15 mg by mouth daily.     Cholecalciferol (VITAMIN D3) 1.25 MG (50000 UT) CAPS Take 1 capsule by mouth once a week.     DiphenhydrAMINE  HCl (BENADRYL  PO) Take by mouth as needed.     DULoxetine (CYMBALTA) 60 MG capsule Take 1 capsule by mouth daily.     gabapentin (NEURONTIN) 600 MG tablet Take 600 mg by mouth 3 (three) times daily.     primidone (MYSOLINE) 50 MG tablet Take 50 mg by mouth 2 (two) times daily.     propranolol (INDERAL) 20 MG tablet Take 20 mg by mouth 2 (two) times daily.     riTUXimab-pvvr (RUXIENCE IV) Inject into the vein.     rosuvastatin (CRESTOR) 20 MG tablet Take 20 mg by mouth at bedtime.     temazepam (RESTORIL) 15 MG capsule  Take 1 capsule by mouth as needed.     valACYclovir  (VALTREX ) 500 MG tablet TAKE 1 TABLET BY MOUTH DAILY,  TAKE 1 TABLET TWICE DAILY FOR 3  DAYS FOR AN OUTBREAK AS NEEDED 110 tablet 0   TOVIAZ 4 MG TB24 tablet Take 1 tablet by mouth 2 (two) times daily. (Patient not taking: Reported on 09/20/2024)     No current facility-administered medications for this visit.    ALLERGIES: Tape and Latex  Family History  Problem Relation Age of Onset   Hyperlipidemia Mother    Hypertension Mother    Hypothyroidism Mother    Cancer Mother        pituitary   Alzheimer's disease Mother    Hyperlipidemia Father    Hypertension Father    Heart disease Father    Heart disease Brother 54       stent placement    Review of Systems  All other systems reviewed and are negative.   PHYSICAL EXAM:  BP 112/64 (BP Location: Left Arm, Patient Position: Sitting)   Pulse 85   Ht 5' 9 (1.753 m)   Wt 142 lb (64.4 kg)   LMP 07/31/2024 (Exact Date) Comment: Last period was April 2025  SpO2 97%   BMI 20.97 kg/m     General appearance: alert, cooperative and appears stated age Head: normocephalic, without obvious abnormality, atraumatic Neck: no adenopathy, supple, symmetrical, trachea midline and thyroid normal to inspection and palpation Lungs: clear to auscultation bilaterally Breasts: normal appearance, no masses or tenderness, No nipple retraction or dimpling, No nipple discharge or bleeding, No axillary adenopathy Heart: regular rate and rhythm Abdomen: soft, non-tender; no masses, no organomegaly Extremities: extremities normal, atraumatic, no cyanosis or edema Skin: skin color, texture, turgor normal. No rashes or lesions Lymph nodes: cervical, supraclavicular, and axillary nodes normal. Neurologic: grossly normal  Pelvic: External genitalia:  no lesions              No abnormal inguinal nodes palpated.              Urethra:  normal appearing urethra with no masses, tenderness or lesions               Bartholins and Skenes: normal                 Vagina: normal appearing vagina with normal color and discharge, no lesions              Cervix: no lesions  Pap taken: no Bimanual Exam:  Uterus:  normal size, contour, position, consistency, mobility, non-tender              Adnexa: no mass, fullness, tenderness              Rectal exam: yes.  Confirms.              Anus:  normal sphincter tone, no lesions  Chaperone was present for exam:  Kari HERO, CMA  ASSESSMENT: Well woman visit with gynecologic exam. MS.  Positive for JC Virus.  Hx LGSIL.  HSV I and II.  Vaginal atrophy.  Hx superficial thrombus of leg. Depression.   PHQ-2-9: 0 Hx lap band.   Hyperlipidemia.  FH CAD.   PLAN: Mammogram screening discussed. Self breast awareness reviewed. Pap and HRV collected:  no.  Due in 2028. Guidelines for Calcium, Vitamin D, regular exercise program including cardiovascular and weight bearing exercise. Medication refills:  valtrex  500 mg daily and then 500 mg po bid x 3 days prn.  #110, RF 3.  Options for atrophy discussed:  cooking oils, lubricants, vaginal estrogen,  Intrarosa.   No rx given today.   Labs with PCP.  Follow up:  yearly and prn.             "

## 2024-09-20 NOTE — Patient Instructions (Signed)

## 2024-09-25 ENCOUNTER — Ambulatory Visit: Admitting: Cardiovascular Disease

## 2024-10-13 ENCOUNTER — Ambulatory Visit: Admitting: Cardiovascular Disease

## 2025-09-24 ENCOUNTER — Ambulatory Visit: Admitting: Obstetrics and Gynecology
